# Patient Record
Sex: Male | Born: 2000
Health system: Southern US, Community
[De-identification: ages and names within clinical notes are randomized; demographics above are authoritative.]

## PROBLEM LIST (undated history)

## (undated) DIAGNOSIS — U071 COVID-19: Secondary | ICD-10-CM

---
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
## 2020-01-21 ENCOUNTER — Other Ambulatory Visit: Payer: Self-pay

## 2020-01-21 ENCOUNTER — Ambulatory Visit (INDEPENDENT_AMBULATORY_CARE_PROVIDER_SITE_OTHER): Payer: Medicaid Other | Admitting: Pediatrics

## 2020-01-21 VITALS — Wt 158.9 lb

## 2020-01-21 DIAGNOSIS — R05 Cough: Secondary | ICD-10-CM | POA: Diagnosis not present

## 2020-01-21 DIAGNOSIS — U071 COVID-19: Secondary | ICD-10-CM | POA: Diagnosis not present

## 2020-01-21 DIAGNOSIS — R059 Cough, unspecified: Secondary | ICD-10-CM

## 2020-01-21 LAB — POC SOFIA SARS ANTIGEN FIA: SARS:: POSITIVE — AB

## 2020-01-21 NOTE — Progress Notes (Signed)
  Subjective:    Jesus Holmes is a 19 y.o. old male here with his self for Headache and Cough   HPI: Jesus Holmes presents with history of 4-5 days of HA, runny nose, sore throat and poor sleep.  He continued to have sore throat, and then started with loss taste and smell 3 days ago.  Having some fatigue and body aches.  Works at [employer] ex and reports wearing masks, no known sick contacts at work or at home.  Denies any diff breathing, fevers, v/d.   The following portions of the patient's history were reviewed and updated as appropriate: allergies, current medications, past family history, past medical history, past social history, past surgical history and problem list.  Review of Systems Pertinent items are noted in HPI.   Allergies: No Known Allergies   Current Outpatient Medications on File Prior to Visit  Medication Sig Dispense Refill  . cetirizine (ZYRTEC) 1 MG/ML syrup Take 10 mLs (10 mg total) by mouth daily. 120 mL 5   No current facility-administered medications on file prior to visit.    History and Problem List: No past medical history on file.      Objective:    Wt 158 lb 14.4 oz (72.1 kg)   BMI 23.64 kg/m   General: alert, active, cooperative, non toxic ENT: oropharynx moist, OP mild erythema, no lesions, nares erythematous  Eye:  PERRL, EOMI, conjunctivae clear, no discharge Ears: TM clear/intact bilateral, no discharge Neck: supple, no sig LAD Lungs: clear to auscultation, no wheeze, crackles or retractions Heart: RRR, Nl S1, S2, no murmurs Abd: soft, non tender, non distended, normal BS, no organomegaly, no masses appreciated Skin: no rashes Neuro: normal mental status, No focal deficits  Results for orders placed or performed in visit on 01/21/20 (from the past 72 hour(s))  POC SOFIA Antigen FIA     Status: Abnormal   Collection Time: 01/21/20  4:19 PM  Result Value Ref Range   SARS: Positive (A) Negative       Assessment:   Jesus Holmes is a 19 y.o. old male  with  1. COVID-19 virus infection   2. Cough     Plan:   1.  Rapid covid positive.  Supportive care discussed for symptoms and what signs to monitor for that would need immediate evaluation at ER.    --Recommendations are to isolate for 10 days after symptom onset or positive test and at least 24hrs without fever.  Isolate in home away from family members and follow current guidelines when unable to distance.  Should have family members tested if onset of symptoms or in 5-8 days asymptomatic.  Discussed concerning symptoms that would need immediate evaluation like shortness of breath, chest pain or persistent symptoms.       No orders of the defined types were placed in this encounter.    Return if symptoms worsen or fail to improve. in 2-3 days or prior for concerns  Myles Gip, DO

## 2020-01-24 ENCOUNTER — Encounter: Payer: Self-pay | Admitting: Pediatrics

## 2020-01-24 NOTE — Patient Instructions (Signed)
COVID-19: Quarantine vs. Isolation QUARANTINE keeps someone who was in close contact with someone who has COVID-19 away from others. If you had close contact with a person who has COVID-19  Stay home until 14 days after your last contact.  Check your temperature twice a day and watch for symptoms of COVID-19.  If possible, stay away from people who are at higher-risk for getting very sick from COVID-19. ISOLATION keeps someone who is sick or tested positive for COVID-19 without symptoms away from others, even in their own home. If you are sick and think or know you have COVID-19  Stay home until after ? At least 10 days since symptoms first appeared and ? At least 24 hours with no fever without fever-reducing medication and ? Symptoms have improved If you tested positive for COVID-19 but do not have symptoms  Stay home until after ? 10 days have passed since your positive test If you live with others, stay in a specific "sick room" or area and away from other people or animals, including pets. Use a separate bathroom, if available. cdc.gov/coronavirus 04/23/2019 This information is not intended to replace advice given to you by your health care provider. Make sure you discuss any questions you have with your health care provider. Document Revised: 09/06/2019 Document Reviewed: 09/06/2019 Elsevier Patient Education  2020 Elsevier Inc.  

## 2020-03-06 ENCOUNTER — Encounter (HOSPITAL_COMMUNITY): Payer: Self-pay

## 2020-03-06 ENCOUNTER — Emergency Department (HOSPITAL_COMMUNITY)
Admission: EM | Admit: 2020-03-06 | Discharge: 2020-03-07 | Discharge status: Against Medical Advice | Payer: Medicaid Other | Attending: Emergency Medicine | Admitting: Emergency Medicine

## 2020-03-06 DIAGNOSIS — Z5321 Procedure and treatment not carried out due to patient leaving prior to being seen by health care provider: Secondary | ICD-10-CM | POA: Insufficient documentation

## 2020-03-06 DIAGNOSIS — J029 Acute pharyngitis, unspecified: Secondary | ICD-10-CM | POA: Diagnosis not present

## 2020-03-06 DIAGNOSIS — Z20822 Contact with and (suspected) exposure to covid-19: Secondary | ICD-10-CM | POA: Insufficient documentation

## 2020-03-06 LAB — CBC WITH DIFFERENTIAL/PLATELET
Abs Immature Granulocytes: 0.02 10*3/uL (ref 0.00–0.07)
Basophils Absolute: 0 10*3/uL (ref 0.0–0.1)
Basophils Relative: 1 %
Eosinophils Absolute: 0 10*3/uL (ref 0.0–0.5)
Eosinophils Relative: 0 %
HCT: 49 % (ref 39.0–52.0)
Hemoglobin: 16.4 g/dL (ref 13.0–17.0)
Immature Granulocytes: 0 %
Lymphocytes Relative: 11 %
Lymphs Abs: 0.7 10*3/uL (ref 0.7–4.0)
MCH: 29.9 pg (ref 26.0–34.0)
MCHC: 33.5 g/dL (ref 30.0–36.0)
MCV: 89.3 fL (ref 80.0–100.0)
Monocytes Absolute: 0.7 10*3/uL (ref 0.1–1.0)
Monocytes Relative: 11 %
Neutro Abs: 4.8 10*3/uL (ref 1.7–7.7)
Neutrophils Relative %: 77 %
Platelets: 140 10*3/uL — ABNORMAL LOW (ref 150–400)
RBC: 5.49 MIL/uL (ref 4.22–5.81)
RDW: 11.9 % (ref 11.5–15.5)
WBC: 6.3 10*3/uL (ref 4.0–10.5)
nRBC: 0 % (ref 0.0–0.2)

## 2020-03-06 LAB — POC SARS CORONAVIRUS 2 AG -  ED: SARS Coronavirus 2 Ag: NEGATIVE

## 2020-03-06 LAB — BASIC METABOLIC PANEL
Anion gap: 13 (ref 5–15)
BUN: 6 mg/dL (ref 6–20)
CO2: 23 mmol/L (ref 22–32)
Calcium: 9.6 mg/dL (ref 8.9–10.3)
Chloride: 100 mmol/L (ref 98–111)
Creatinine, Ser: 1.18 mg/dL (ref 0.61–1.24)
GFR calc Af Amer: >60 mL/min (ref >60)
GFR calc non Af Amer: >60 mL/min (ref >60)
Glucose, Bld: 95 mg/dL (ref 70–99)
Potassium: 3.7 mmol/L (ref 3.5–5.1)
Sodium: 136 mmol/L (ref 135–145)

## 2020-03-06 LAB — GROUP A STREP BY PCR: Group A Strep by PCR: NOT DETECTED

## 2020-03-06 MED ORDER — ACETAMINOPHEN 325 MG PO TABS
650.0000 mg | ORAL_TABLET | Freq: Once | ORAL | Status: AC | PRN
  Administered 2020-03-06: 650 mg via ORAL
  Filled 2020-03-06: qty 2

## 2020-03-06 NOTE — ED Triage Notes (Signed)
Pt arrives to ED w/ c/o sore throat, fever, body aches, and neck stiffness.

## 2020-03-06 NOTE — ED Notes (Signed)
Pt's Mom politely stated that they needed to go home.  Temp taken and is 100.3.  Advised that he had tylenol at roughly 1730.  She stated she would give more at home.

## 2020-03-09 ENCOUNTER — Inpatient Hospital Stay (HOSPITAL_COMMUNITY)
Admission: EM | Admit: 2020-03-09 | Discharge: 2020-03-11 | DRG: 287 | Disposition: A | Discharge status: Home / Self Care | Payer: Medicaid Other | Attending: Cardiology | Admitting: Cardiology

## 2020-03-09 ENCOUNTER — Encounter (HOSPITAL_COMMUNITY): Payer: Self-pay

## 2020-03-09 ENCOUNTER — Encounter (HOSPITAL_COMMUNITY)
Admission: EM | Disposition: A | Discharge status: Home / Self Care | Payer: Self-pay | Source: Home / Self Care | Attending: Cardiology

## 2020-03-09 ENCOUNTER — Other Ambulatory Visit: Payer: Self-pay

## 2020-03-09 ENCOUNTER — Emergency Department (HOSPITAL_COMMUNITY): Payer: Medicaid Other

## 2020-03-09 DIAGNOSIS — B948 Sequelae of other specified infectious and parasitic diseases: Secondary | ICD-10-CM

## 2020-03-09 DIAGNOSIS — I5082 Biventricular heart failure: Secondary | ICD-10-CM | POA: Diagnosis present

## 2020-03-09 DIAGNOSIS — I309 Acute pericarditis, unspecified: Secondary | ICD-10-CM | POA: Diagnosis not present

## 2020-03-09 DIAGNOSIS — I5021 Acute systolic (congestive) heart failure: Secondary | ICD-10-CM | POA: Diagnosis not present

## 2020-03-09 DIAGNOSIS — I400 Infective myocarditis: Secondary | ICD-10-CM

## 2020-03-09 DIAGNOSIS — I514 Myocarditis, unspecified: Secondary | ICD-10-CM | POA: Diagnosis not present

## 2020-03-09 DIAGNOSIS — R9431 Abnormal electrocardiogram [ECG] [EKG]: Secondary | ICD-10-CM

## 2020-03-09 DIAGNOSIS — Z8249 Family history of ischemic heart disease and other diseases of the circulatory system: Secondary | ICD-10-CM

## 2020-03-09 DIAGNOSIS — Z20822 Contact with and (suspected) exposure to covid-19: Secondary | ICD-10-CM | POA: Diagnosis present

## 2020-03-09 DIAGNOSIS — R072 Precordial pain: Secondary | ICD-10-CM | POA: Diagnosis not present

## 2020-03-09 DIAGNOSIS — R079 Chest pain, unspecified: Secondary | ICD-10-CM | POA: Diagnosis not present

## 2020-03-09 DIAGNOSIS — I10 Essential (primary) hypertension: Secondary | ICD-10-CM | POA: Diagnosis not present

## 2020-03-09 DIAGNOSIS — R509 Fever, unspecified: Secondary | ICD-10-CM | POA: Diagnosis not present

## 2020-03-09 DIAGNOSIS — B3322 Viral myocarditis: Secondary | ICD-10-CM | POA: Diagnosis not present

## 2020-03-09 HISTORY — PX: RIGHT/LEFT HEART CATH AND CORONARY ANGIOGRAPHY: CATH118266

## 2020-03-09 HISTORY — DX: COVID-19: U07.1

## 2020-03-09 LAB — TROPONIN I (HIGH SENSITIVITY)
Troponin I (High Sensitivity): 9910 ng/L (ref <18)
Troponin I (High Sensitivity): 24304 ng/L (ref <18)

## 2020-03-09 LAB — COMPREHENSIVE METABOLIC PANEL
ALT: 69 U/L — ABNORMAL HIGH (ref 0–44)
AST: 100 U/L — ABNORMAL HIGH (ref 15–41)
Albumin: 3.7 g/dL (ref 3.5–5.0)
Alkaline Phosphatase: 94 U/L (ref 38–126)
Anion gap: 13 (ref 5–15)
BUN: 8 mg/dL (ref 6–20)
CO2: 22 mmol/L (ref 22–32)
Calcium: 9.3 mg/dL (ref 8.9–10.3)
Chloride: 103 mmol/L (ref 98–111)
Creatinine, Ser: 1.01 mg/dL (ref 0.61–1.24)
GFR calc Af Amer: >60 mL/min (ref >60)
GFR calc non Af Amer: >60 mL/min (ref >60)
Glucose, Bld: 123 mg/dL — ABNORMAL HIGH (ref 70–99)
Potassium: 3.6 mmol/L (ref 3.5–5.1)
Sodium: 138 mmol/L (ref 135–145)
Total Bilirubin: 1.1 mg/dL (ref 0.3–1.2)
Total Protein: 7 g/dL (ref 6.5–8.1)

## 2020-03-09 LAB — CBC
HCT: 46.3 % (ref 39.0–52.0)
Hemoglobin: 15.8 g/dL (ref 13.0–17.0)
MCH: 29.5 pg (ref 26.0–34.0)
MCHC: 34.1 g/dL (ref 30.0–36.0)
MCV: 86.5 fL (ref 80.0–100.0)
Platelets: 152 10*3/uL (ref 150–400)
RBC: 5.35 MIL/uL (ref 4.22–5.81)
RDW: 12.1 % (ref 11.5–15.5)
WBC: 6.4 10*3/uL (ref 4.0–10.5)
nRBC: 0 % (ref 0.0–0.2)

## 2020-03-09 LAB — APTT: aPTT: 110 seconds — ABNORMAL HIGH (ref 24–36)

## 2020-03-09 LAB — C-REACTIVE PROTEIN: CRP: 14.2 mg/dL — ABNORMAL HIGH (ref <1.0)

## 2020-03-09 LAB — SARS CORONAVIRUS 2 BY RT PCR (HOSPITAL ORDER, PERFORMED IN ~~LOC~~ HOSPITAL LAB): SARS Coronavirus 2: NEGATIVE

## 2020-03-09 LAB — PROTIME-INR
INR: 1.2 (ref 0.8–1.2)
Prothrombin Time: 14.2 seconds (ref 11.4–15.2)

## 2020-03-09 LAB — LIPID PANEL
Cholesterol: 136 mg/dL (ref 0–200)
HDL: 39 mg/dL — ABNORMAL LOW (ref >40)
LDL Cholesterol: 84 mg/dL (ref 0–99)
Total CHOL/HDL Ratio: 3.5 RATIO
Triglycerides: 63 mg/dL (ref <150)
VLDL: 13 mg/dL (ref 0–40)

## 2020-03-09 LAB — TSH: TSH: 1.75 u[IU]/mL (ref 0.350–4.500)

## 2020-03-09 LAB — HEMOGLOBIN A1C
Hgb A1c MFr Bld: 5.4 % (ref 4.8–5.6)
Mean Plasma Glucose: 108.28 mg/dL

## 2020-03-09 LAB — HIV ANTIBODY (ROUTINE TESTING W REFLEX): HIV Screen 4th Generation wRfx: NONREACTIVE

## 2020-03-09 LAB — SEDIMENTATION RATE: Sed Rate: 12 mm/hr (ref 0–16)

## 2020-03-09 LAB — MRSA PCR SCREENING: MRSA by PCR: NEGATIVE

## 2020-03-09 SURGERY — RIGHT/LEFT HEART CATH AND CORONARY ANGIOGRAPHY
Anesthesia: LOCAL

## 2020-03-09 MED ORDER — VERAPAMIL HCL 2.5 MG/ML IV SOLN
INTRAVENOUS | Status: DC | PRN
  Administered 2020-03-09: 10 mL via INTRA_ARTERIAL

## 2020-03-09 MED ORDER — SODIUM CHLORIDE 0.9% FLUSH
3.0000 mL | Freq: Once | INTRAVENOUS | Status: AC
  Administered 2020-03-09: 3 mL via INTRAVENOUS

## 2020-03-09 MED ORDER — FENTANYL CITRATE (PF) 100 MCG/2ML IJ SOLN
INTRAMUSCULAR | Status: AC
  Filled 2020-03-09: qty 2

## 2020-03-09 MED ORDER — HEPARIN SODIUM (PORCINE) 1000 UNIT/ML IJ SOLN
INTRAMUSCULAR | Status: DC | PRN
  Administered 2020-03-09: 4000 [IU] via INTRAVENOUS

## 2020-03-09 MED ORDER — ASPIRIN 81 MG PO CHEW
324.0000 mg | CHEWABLE_TABLET | Freq: Once | ORAL | Status: AC
  Administered 2020-03-09: 324 mg via ORAL
  Filled 2020-03-09: qty 4

## 2020-03-09 MED ORDER — COLCHICINE 0.6 MG PO TABS
0.6000 mg | ORAL_TABLET | Freq: Two times a day (BID) | ORAL | Status: DC
  Administered 2020-03-09 – 2020-03-11 (×5): 0.6 mg via ORAL
  Filled 2020-03-09 (×5): qty 1

## 2020-03-09 MED ORDER — VERAPAMIL HCL 2.5 MG/ML IV SOLN
INTRAVENOUS | Status: AC
  Filled 2020-03-09: qty 2

## 2020-03-09 MED ORDER — NITROGLYCERIN 0.4 MG SL SUBL: 0.4000 mg | SUBLINGUAL_TABLET | SUBLINGUAL | Status: DC | PRN

## 2020-03-09 MED ORDER — LIDOCAINE HCL (PF) 1 % IJ SOLN
INTRAMUSCULAR | Status: DC | PRN
  Administered 2020-03-09: 20 mL
  Administered 2020-03-09: 3 mL

## 2020-03-09 MED ORDER — MIDAZOLAM HCL 2 MG/2ML IJ SOLN
INTRAMUSCULAR | Status: AC
  Filled 2020-03-09: qty 2

## 2020-03-09 MED ORDER — ENOXAPARIN SODIUM 40 MG/0.4ML ~~LOC~~ SOLN
40.0000 mg | SUBCUTANEOUS | Status: DC
  Administered 2020-03-10 – 2020-03-11 (×2): 40 mg via SUBCUTANEOUS
  Filled 2020-03-09 (×2): qty 0.4

## 2020-03-09 MED ORDER — IBUPROFEN 600 MG PO TABS
800.0000 mg | ORAL_TABLET | Freq: Three times a day (TID) | ORAL | Status: DC
  Administered 2020-03-09 – 2020-03-11 (×6): 800 mg via ORAL
  Filled 2020-03-09 (×4): qty 4
  Filled 2020-03-09 (×2): qty 1

## 2020-03-09 MED ORDER — FENTANYL CITRATE (PF) 100 MCG/2ML IJ SOLN
INTRAMUSCULAR | Status: DC | PRN
  Administered 2020-03-09 (×2): 25 ug via INTRAVENOUS

## 2020-03-09 MED ORDER — HEPARIN SODIUM (PORCINE) 1000 UNIT/ML IJ SOLN
INTRAMUSCULAR | Status: AC
  Filled 2020-03-09: qty 1

## 2020-03-09 MED ORDER — ACETAMINOPHEN 325 MG PO TABS: 650.0000 mg | ORAL_TABLET | ORAL | Status: DC | PRN

## 2020-03-09 MED ORDER — ONDANSETRON HCL 4 MG/2ML IJ SOLN: 4.0000 mg | Freq: Four times a day (QID) | INTRAMUSCULAR | Status: DC | PRN

## 2020-03-09 MED ORDER — LIDOCAINE HCL (PF) 1 % IJ SOLN
INTRAMUSCULAR | Status: AC
  Filled 2020-03-09: qty 30

## 2020-03-09 MED ORDER — IOHEXOL 350 MG/ML SOLN
INTRAVENOUS | Status: DC | PRN
  Administered 2020-03-09: 50 mL via INTRA_ARTERIAL

## 2020-03-09 MED ORDER — HEPARIN (PORCINE) IN NACL 1000-0.9 UT/500ML-% IV SOLN
INTRAVENOUS | Status: DC | PRN
  Administered 2020-03-09 (×2): 500 mL

## 2020-03-09 MED ORDER — SODIUM CHLORIDE 0.9 % IV SOLN
INTRAVENOUS | Status: AC | PRN
  Administered 2020-03-09: 10 mL/h via INTRAVENOUS

## 2020-03-09 MED ORDER — MIDAZOLAM HCL 2 MG/2ML IJ SOLN
INTRAMUSCULAR | Status: DC | PRN
  Administered 2020-03-09 (×2): 1 mg via INTRAVENOUS

## 2020-03-09 MED ORDER — KETOROLAC TROMETHAMINE 30 MG/ML IJ SOLN
30.0000 mg | Freq: Once | INTRAMUSCULAR | Status: AC
  Administered 2020-03-09: 30 mg via INTRAVENOUS
  Filled 2020-03-09: qty 1

## 2020-03-09 MED ORDER — HEPARIN (PORCINE) IN NACL 1000-0.9 UT/500ML-% IV SOLN
INTRAVENOUS | Status: AC
  Filled 2020-03-09: qty 1000

## 2020-03-09 MED ORDER — PANTOPRAZOLE SODIUM 40 MG PO TBEC
40.0000 mg | DELAYED_RELEASE_TABLET | Freq: Every day | ORAL | Status: DC
  Administered 2020-03-09 – 2020-03-11 (×3): 40 mg via ORAL
  Filled 2020-03-09 (×4): qty 1

## 2020-03-09 SURGICAL SUPPLY — 13 items
CATH INFINITI JR4 5F (CATHETERS) ×2 IMPLANT
CATH OPTITORQUE TIG 4.0 6F (CATHETERS) ×2 IMPLANT
CATH SWAN GANZ 7F STRAIGHT (CATHETERS) ×2 IMPLANT
DEVICE RAD COMP TR BAND LRG (VASCULAR PRODUCTS) ×2 IMPLANT
GLIDESHEATH SLEND SS 6F .021 (SHEATH) ×2 IMPLANT
GUIDEWIRE INQWIRE 1.5J.035X260 (WIRE) ×1 IMPLANT
INQWIRE 1.5J .035X260CM (WIRE) ×2
KIT HEART LEFT (KITS) ×2 IMPLANT
PACK CARDIAC CATHETERIZATION (CUSTOM PROCEDURE TRAY) ×2 IMPLANT
SHEATH PINNACLE 7F 10CM (SHEATH) ×2 IMPLANT
SHEATH PROBE COVER 6X72 (BAG) ×2 IMPLANT
TRANSDUCER W/STOPCOCK (MISCELLANEOUS) ×2 IMPLANT
TUBING CIL FLEX 10 FLL-RA (TUBING) ×2 IMPLANT

## 2020-03-09 NOTE — Progress Notes (Signed)
° °  See H and P  CRITICAL CARE Performed by: Donato Schultz   Total critical care time: 60 minutes  Critical care time was exclusive of separately billable procedures and treating other patients.  Critical care was necessary to treat or prevent imminent or life-threatening deterioration.  Critical care was time spent personally by me on the following activities: development of treatment plan with patient and/or surrogate as well as nursing, discussions with consultants, evaluation of patient's response to treatment, examination of patient, obtaining history from patient or surrogate, ordering and performing treatments and interventions, ordering and review of laboratory studies, ordering and review of radiographic studies, pulse oximetry and re-evaluation of patient's condition.  Donato Schultz, MD

## 2020-03-09 NOTE — H&P (Addendum)
History & Physical    Patient ID: Jesus Holmes MRN: [id number], DOB/AGE: May 24, 2001   Admit date: 03/09/2020  Primary Care Provider: Pediatrics, [facility] Cardiologist: New to [facility] - Dr. Anne Fu  Chief Complaint: Chest Pain  Patient Profile    Jesus Holmes is a 19 y.o. male with prior history of COVID-19 virus (positive in 01/2020) and no prior cardiac history who presented to [facility] on 03/09/2020 for evaluation of chest pain. Initial EKG showed diffuse ST elevation along the inferior and anterolateral leads with CODE STEMI being activated.   History of Present Illness    Jesus Holmes reports being in his usual state of health until this past Tuesday when he developed subjective fever and chills. He reports a mild discomfort along his precordial region at that time but symptoms continued to intensify. His episodes of chest discomfort were episodic yesterday but progressed throughout the day. Symptoms were exacerbated with lying down and he slept on his stomach last night for relief. This morning, his chest discomfort persisted and he reports associated radiating pain into the middle of his back along with his left arm. He informed his father who recommended ED evaluation.  The patient is unaware of any personal history of cardiac issues. He was diagnosed with COVID-19 in 01/2020 but denies any persistent dyspnea or symptoms since his diagnosis. He does have a paternal uncle who was diagnosed with CAD at age 21. His paternal grandfather is followed by cardiology for a murmur per their report. The patient denies any history of tobacco use or recreational drug use.  A majority of his labs are still pending at this time but WBC is normal at 6.4. Hemoglobin at 15.8 with platelets of 152. Hgb A1c at 5.4. HS Troponin, CRP and Sed rate are pending. CXR pending. EKG shows NSR, HR 67 with ST elevation along the inferior and anterolateral leads with reciprocal changes present.     He still reports significant pain at this time. Scheduled to receive ASA 324mg  and IV Toradol 30mg .    Past Medical History:  Diagnosis Date  . COVID-19 virus detected    a. positive for COVID -19 in 01/2020.    History reviewed. No pertinent surgical history.   Medications Prior to Admission: Prior to Admission medications   Medication Sig Start Date End Date Taking? Authorizing Provider  cetirizine (ZYRTEC) 1 MG/ML syrup Take 10 mLs (10 mg total) by mouth daily. 07/22/15 08/22/15  07/24/15, MD     Allergies:   No Known Allergies  Social History:   Social History   Socioeconomic History  . Marital status: Single    Spouse name: Not on file  . Number of children: Not on file  . Years of education: Not on file  . Highest education level: Not on file  Occupational History  . Not on file  Tobacco Use  . Smoking status: Never Smoker  . Smokeless tobacco: Never Used  Substance and Sexual Activity  . Alcohol use: No  . Drug use: No  . Sexual activity: Never  Other Topics Concern  . Not on file  Social History Narrative   08/24/15 in [place]   freshman   Social Determinants of Health   Financial Resource Strain:   . Difficulty of Paying Living Expenses:   Food Insecurity:   . Worried About Rennis Golden in the Last Year:   . [employer] of Food in the Last Year:  Transportation Needs:   . Freight forwarder (Medical):   Marland Kitchen Lack of Transportation (Non-Medical):   Physical Activity:   . Days of Exercise per Week:   . Minutes of Exercise per Session:   Stress:   . Feeling of Stress :   Social Connections:   . Frequency of Communication with Friends and Family:   . Frequency of Social Gatherings with Friends and Family:   . Attends Religious Services:   . Active Member of Clubs or Organizations:   . Attends Banker Meetings:   Marland Kitchen Marital Status:   Intimate Partner Violence:   . Fear of Current or Ex-Partner:   .  Emotionally Abused:   Marland Kitchen Physically Abused:   . Sexually Abused:      Family History:  The patient's family history includes Cancer in his maternal grandfather and maternal grandmother; Diabetes in his maternal grandfather, maternal grandmother, and paternal grandmother; Heart disease in his paternal grandfather; Hyperlipidemia in his paternal grandfather and paternal grandmother; Hypertension in his paternal grandfather and paternal grandmother; Sleep apnea in his father. There is no history of Hearing loss, Early death, Drug abuse, Depression, COPD, Birth defects, Asthma, Arthritis, Alcohol abuse, Kidney disease, Learning disabilities, Mental retardation, Miscarriages / Stillbirths, Stroke, Vision loss, or Varicose Veins.     Review of Systems    General:  No chills, fever, night sweats or weight changes.  Cardiovascular:  No  dyspnea on exertion, edema, orthopnea, palpitations, paroxysmal nocturnal dyspnea. Positive for chest pain.  Dermatological: No rash, lesions/masses Respiratory: No cough, dyspnea Urologic: No hematuria, dysuria Abdominal:   No nausea, vomiting, diarrhea, bright red blood per rectum, melena, or hematemesis Neurologic:  No visual changes, wkns, changes in mental status. All other systems reviewed and are otherwise negative except as noted above.  Physical Exam    Vitals:   03/09/20 1233 03/09/20 1254  BP: 108/90   Pulse: 68   Resp: 17   Temp: 98.4 F (36.9 C)   SpO2: 99%   Weight: 81.6 kg 81.6 kg  Height: 5\' 11"  (1.803 m) 5\' 11"  (1.803 m)   No intake or output data in the 24 hours ending 03/09/20 1332 Filed Weights   03/09/20 1233 03/09/20 1254  Weight: 81.6 kg 81.6 kg   Body mass index is 25.1 kg/m.   General: Well developed, well nourished,male in no acute distress. Head: Normocephalic, atraumatic, sclera non-icteric, no xanthomas, nares are without discharge. Neck: No carotid bruits. JVD not elevated.  Lungs: Respirations regular and unlabored,  without wheezes or rales.  Heart: Regular rate and rhythm. No S3 or S4.  No murmur, no rubs, or gallops appreciated. Abdomen: Soft, non-tender, non-distended with normoactive bowel sounds. No hepatomegaly. No rebound/guarding. No obvious abdominal masses. Msk:  Strength and tone appear normal for age. No joint deformities or effusions. Extremities: No clubbing or cyanosis. No edema.  Distal pedal pulses are 2+ bilaterally. Neuro: Alert and oriented X 3. Moves all extremities spontaneously. No focal deficits noted. Psych:  Responds to questions appropriately with a normal affect. Skin: No rashes or lesions noted  Labs and Radiology Studies    EKG:  The ECG that was done was personally reviewed and demonstrates NSR, HR 67 with ST elevation along the inferior and anterolateral leads with reciprocal changes present.    Relevant CV Studies:  Echocardiogram: Pending  Laboratory Data:  Chemistry Recent Labs  Lab 03/06/20 2011  NA 136  K 3.7  CL 100  CO2 23  GLUCOSE 95  BUN 6  CREATININE 1.18  CALCIUM 9.6  GFRNONAA >60  GFRAA >60  ANIONGAP 13    No results for input(s): PROT, ALBUMIN, AST, ALT, ALKPHOS, BILITOT in the last 168 hours. Hematology Recent Labs  Lab 03/06/20 2011 03/09/20 1246  WBC 6.3 6.4  RBC 5.49 5.35  HGB 16.4 15.8  HCT 49.0 46.3  MCV 89.3 86.5  MCH 29.9 29.5  MCHC 33.5 34.1  RDW 11.9 12.1  PLT 140* 152   Cardiac EnzymesNo results for input(s): TROPONINI in the last 168 hours. No results for input(s): TROPIPOC in the last 168 hours.  BNPNo results for input(s): BNP, PROBNP in the last 168 hours.  DDimer No results for input(s): DDIMER in the last 168 hours.  Radiology/Studies:  DG Chest Port 1 View  Result Date: 03/09/2020 CLINICAL DATA:  Chest pain and tightness since Tuesday, fever at home, code STEMI EXAM: PORTABLE CHEST 1 VIEW COMPARISON:  Portable exam 1313 hours without priors for comparison FINDINGS: Normal heart size, mediastinal contours, and  pulmonary vascularity. Lungs clear. No pleural effusion or pneumothorax. Bones unremarkable. IMPRESSION: No acute abnormalities. Electronically Signed   By: Lavonia Dana M.D.   On: 03/09/2020 13:29    Assessment and Plan:   1. Chest Pain concerning for Myocarditis - reports episodic fever and chills which started 5 days prior to his presentation but he has experienced chest discomfort for the past several days. Symptoms progressed in severity over the past 24 hours and pain has been exacerbated with reclining on his back and improved with resting on his stomach.  -  HS Troponin, CRP and Sed rate are pending. EKG shows NSR, HR 67 with ST elevation along the inferior and anterolateral leads with reciprocal changes present.   - Will plan to obtain an official echocardiogram. Reviewed with Dr. Marlou Porch and will order Ibuprofen 800mg  TID and Colchicine 0.6mg  BID. No Heparin for now.   ADDENDUM: Dr. Marlou Porch performed a bedside US which showed biventricular dysfunction. Discussed further with Dr. Aundra Dubin and Dr. Claiborne Billings and the decision was made to proceed with a cardiac catheterization. Further recommendations pending the procedure.   Severity of Illness: The appropriate patient status for this patient is INPATIENT. Inpatient status is judged to be reasonable and necessary in order to provide the required intensity of service to ensure the patient's safety. The patient's presenting symptoms, physical exam findings, and initial radiographic and laboratory data in the context of their chronic comorbidities is felt to place them at high risk for further clinical deterioration. Furthermore, it is not anticipated that the patient will be medically stable for discharge from the hospital within 2 midnights of admission. The following factors support the patient status of inpatient.   " The patient's presenting symptoms include chest pain. " The initial radiographic and laboratory data are worrisome because of abnormal  EKG and Elevated HS Troponin. " The chronic co-morbidities include none.   * I certify that at the point of admission it is my clinical judgment that the patient will require inpatient hospital care spanning beyond 2 midnights from the point of admission due to high intensity of service, high risk for further deterioration and high frequency of surveillance required.*    For questions or updates, please contact [place] Please consult [email or web address] for contact info under Cardiology/STEMI.   Signed, Erma Heritage, PA-C 03/09/2020, 1:32 PM Pager: [phone]  Personally seen and examined. Agree with above.   19 year old male with Covid infection approximately 1 month ago with loss  of taste, no shortness of breath, works at [employer], quite active, occasional smoker with no prior cardiac history no diabetes no hypertension who came in today after 4 days of worsening chest discomfort, last night hard to lay flat and sleep, chest and back discomfort occasional left arm discomfort.  He also had fevers he states as well.  Here in the ER, we were able to do a bedside echocardiogram which demonstrated biventricular failure with RV dilatation, decreased contraction as well as what appears to be global left ventricular dysfunction EF approximately 30%.  No significant valvular abnormalities noted.  EKG was concerning for marked inferior lateral ST elevation, highest point in V5 approximately 7 mm.  GEN: Well nourished, well developed, in no acute distress, appears uncomfortable in the bed when laying down.  Trying to sit up.  Able to answer questions appropriately. HEENT: normal  Neck: no JVD, carotid bruits, or masses Cardiac: RRR; no murmurs, rubs, or gallops,no edema  Respiratory:  clear to auscultation bilaterally, normal work of breathing GI: soft, nontender, nondistended, + BS MS: no deformity or atrophy  Skin: warm and dry, no rash Neuro:  Alert and Oriented x 3, Strength and  sensation are intact Psych: euthymic mood, full affect  Father originally at bedside, mother came in as well as we were discussing plans for heart catheterization.  Troponin came back at 9900 White count 6.4 LDL 84 creatinine 1.01 AST 100 ALT 69  Assessment and plan:  Acute myopericarditis -EKG demonstrates fairly significant ST elevations in the inferior as well as the lateral precordial leads.  These are quite concerning.  Do not follow the typical J-point elevation type pattern as we normally see and pericarditis however given his history of 4 days of worsening discomfort as well as fever and recent Covid infection, this is likely to represent myopericarditis.  After further discussion with multiple team members including Dr. Tresa Endo of interventional cardiology and Dr. Shirlee Latch of advanced heart failure, we have decided to proceed with right and left heart catheterization not only to exclude the possible thrombotic lesion but also to check his right and left sided heart pressures and to measure cardiac outputs.  Once again, he does appear to have biventricular failure on bedside echocardiogram.  We will obtain a formal echocardiogram as well. -We have given him Toradol 30 mg IV in the emergency room.  We will likely proceed with ibuprofen and colchicine as well.  Checking C-reactive protein and sedimentation rate as well as TSH. -Tomorrow likely to obtain cardiac MRI to look for other signs of inflammation. -Risks and benefits have been explained for stroke heart attack death renal impairment bleeding. Willing to proceed. Mother and father aware, discussed.   Donato Schultz, MD

## 2020-03-09 NOTE — ED Notes (Signed)
Cardiology at bedside with ultrasound

## 2020-03-09 NOTE — ED Provider Notes (Signed)
Medical screening examination/treatment/procedure(s) were conducted as a shared visit with non-physician practitioner(s) and myself.  I personally evaluated the patient during the encounter. Briefly, the patient is a 19 y.o. male no significant medical history presents the ED with chest pain.  EKG upon arrival concerning for STEMI given ST elevations inferiorly and laterally with ST depressions anteriorly.  Code STEMI activated.  Dr. Anne Fu from cardiology came down to the ED to evaluate the patient.  Patient did have Covid several months ago.  Has had fever and viral type symptoms over the last several days.  Bedside ultrasound performed by myself showed no large pericardial effusion.  Did show decreased EF.  Troponin came back at 9900.  Otherwise lab work overall unremarkable.  Patient to be discussed with interventional cardiologist by Dr. Anne Fu and after discussion they will take the patient to the Cath Lab to rule out any obstruction/ACS however likely a myocarditis.  Chest x-ray overall showed no pleural effusion or pulmonary edema.  Patient overall appears comfortable on exam.  Patient went to the Cath Lab and will be admitted to cardiac ICU for further care.  This chart was dictated using voice recognition software.  Despite best efforts to proofread,  errors can occur which can change the documentation meaning.     EKG Interpretation  Date/Time:  Sunday March 09 2020 12:47:23 EDT Ventricular Rate:  66 PR Interval:    QRS Duration: 87 QT Interval:  380 QTC Calculation: 399 R Axis:   92 Text Interpretation: Sinus or ectopic atrial rhythm Borderline right axis deviation LVH by voltage  ST elevation, inferior leads.lateral leads as well Confirmed by Virgina Norfolk [phone]) on 03/09/2020 1:43:15 PM           Virgina Norfolk, DO 03/09/20 1346

## 2020-03-09 NOTE — Progress Notes (Signed)
Dr. Anne Fu discussed with Dr. Shirlee Latch and Dr. Shirlee Latch told him to put on CHF service. I have assigned to their rounds.

## 2020-03-09 NOTE — Interval H&P Note (Signed)
Cath Lab Visit (complete for each Cath Lab visit)  Clinical Evaluation Leading to the Procedure:   ACS: Yes.    Non-ACS:    Anginal Classification: CCS IV  Anti-ischemic medical therapy: No Therapy  Non-Invasive Test Results: No non-invasive testing performed  Prior CABG: No previous CABG      History and Physical Interval Note:  03/09/2020 2:16 PM  Jesus Holmes  has presented today for surgery, with the diagnosis of STEMI.  The various methods of treatment have been discussed with the patient and family. After consideration of risks, benefits and other options for treatment, the patient has consented to  Procedure(s): Coronary/Graft Acute MI Revascularization (N/A) LEFT HEART CATH AND CORONARY ANGIOGRAPHY (N/A) as a surgical intervention.  The patient's history has been reviewed, patient examined, no change in status, stable for surgery.  I have reviewed the patient's chart and labs.  Questions were answered to the patient's satisfaction.     [name]

## 2020-03-09 NOTE — ED Provider Notes (Addendum)
[facility] EMERGENCY DEPARTMENT Provider Note   CSN: [id number] Arrival date & time: 03/09/20  1229     History Chief Complaint  Patient presents with   Code STEMI   Chest Pain    Jesus Holmes is a 19 y.o. male presents to the ER for evaluation of chest pain.  Code STEMI called from triage by EDP Curatolo.  Patient reports chest pain that began 2 days ago.  It is described as tight, substernal with some radiation to the epigastrium, left arm.  Initially the chest pain was intermittent and worse when he tried to go to sleep, lay on his back and on his side and better when he laid on his stomach to sleep and leaned forward but today the pain has been constant without any alleviating factors.  Chest pain is worse with moving, standing, coughing, breathing. Reports 5 days ago he started feeling bad and had low-grade fever, sore throat, cough, body aches.  He came to the ER to be evaluated but left without being seen due to wait time.  He tested positive for Covid in April 2021 and has felt fine after he completed his quarantine period.  He denies any significant past medical history.  He denies any history of tobacco use.  No IV drug use.  States his grandparents had heart issues but unsure of age of onset.  No interventions.  No modifying factors.  Denies significant shortness of breath, nausea, vomiting. No syncope. No leg swelling or calf pain. No history of blood clots.   HPI     Past Medical History:  Diagnosis Date   COVID-19 virus detected    a. positive for COVID -19 in 01/2020.    Patient Active Problem List   Diagnosis Date Noted   Myocarditis ([place]) 03/09/2020   Encounter for PPD skin test reading 06/06/2019   BMI (body mass index), pediatric, 5% to less than 85% for age 77/19/2017   Encounter for PPD test 06/02/2013    History reviewed. No pertinent surgical history.     Family History  Problem Relation Age of Onset   Sleep apnea Father      Cancer Maternal Grandmother        ovarian   Diabetes Maternal Grandmother    Cancer Maternal Grandfather        prostate   Diabetes Maternal Grandfather    Diabetes Paternal Grandmother    Hyperlipidemia Paternal Grandmother    Hypertension Paternal Grandmother    Hypertension Paternal Grandfather    Hyperlipidemia Paternal Grandfather    Heart disease Paternal Grandfather    Hearing loss Neg Hx    Early death Neg Hx    Drug abuse Neg Hx    Depression Neg Hx    COPD Neg Hx    Birth defects Neg Hx    Asthma Neg Hx    Arthritis Neg Hx    Alcohol abuse Neg Hx    Kidney disease Neg Hx    Learning disabilities Neg Hx    Mental retardation Neg Hx    Miscarriages / Stillbirths Neg Hx    Stroke Neg Hx    Vision loss Neg Hx    Varicose Veins Neg Hx     Social History   Tobacco Use   Smoking status: Never Smoker   Smokeless tobacco: Never Used  Substance Use Topics   Alcohol use: No   Drug use: No    Home Medications Prior to Admission medications  Medication Sig Start Date End Date Taking? Authorizing Provider  cetirizine (ZYRTEC) 1 MG/ML syrup Take 10 mLs (10 mg total) by mouth daily. 07/22/15 08/22/15  Marcha Solders, MD    Allergies    Patient has no known allergies.  Review of Systems   Review of Systems  Constitutional: Positive for fever.  HENT: Positive for sore throat.   Respiratory: Positive for choking.   Cardiovascular: Positive for chest pain.  Musculoskeletal: Positive for myalgias (left arm).  All other systems reviewed and are negative.   Physical Exam Updated Vital Signs BP (!) 124/96    Pulse 69    Temp 98.4 F (36.9 C)    Resp 18    Ht '5\' 11"'  (1.803 m)    Wt 81.6 kg    SpO2 98%    BMI 25.10 kg/m   Physical Exam Constitutional:      Appearance: He is well-developed.     Comments: NAD. Non toxic.   HENT:     Head: Normocephalic and atraumatic.     Nose: Nose normal.  Eyes:     General: Lids are  normal.     Conjunctiva/sclera: Conjunctivae normal.  Neck:     Trachea: Trachea normal.     Comments: Trachea midline.  Cardiovascular:     Rate and Rhythm: Normal rate and regular rhythm.     Pulses:          Radial pulses are 1+ on the right side and 1+ on the left side.       Dorsalis pedis pulses are 1+ on the right side and 1+ on the left side.     Heart sounds: Normal heart sounds, S1 normal and S2 normal.     Comments: No murmurs. No LE edema or calf tenderness.  Pulmonary:     Effort: Pulmonary effort is normal.     Breath sounds: Normal breath sounds.  Chest:     Comments: Tenderness in epigastrium/subxiphoid during US Abdominal:     General: Bowel sounds are normal.     Palpations: Abdomen is soft.     Tenderness: There is no abdominal tenderness.     Comments: No epigastric or upper abdominal tenderness.  Musculoskeletal:     Cervical back: Normal range of motion.  Skin:    General: Skin is warm and dry.     Capillary Refill: Capillary refill takes less than 2 seconds.     Comments: No rash to chest wall  Neurological:     Mental Status: He is alert.     GCS: GCS eye subscore is 4. GCS verbal subscore is 5. GCS motor subscore is 6.     Comments: Sensation and strength intact in upper/lower extremities  Psychiatric:        Speech: Speech normal.        Behavior: Behavior normal.        Thought Content: Thought content normal.     ED Results / Procedures / Treatments   Labs (all labs ordered are listed, but only abnormal results are displayed) Labs Reviewed  LIPID PANEL - Abnormal; Notable for the following components:      Result Value   HDL 39 (*)    All other components within normal limits  COMPREHENSIVE METABOLIC PANEL - Abnormal; Notable for the following components:   Glucose, Bld 123 (*)    AST 100 (*)    ALT 69 (*)    All other components within normal limits  TROPONIN I (HIGH  SENSITIVITY) - Abnormal; Notable for the following components:    Troponin I (High Sensitivity) 9,910 (*)    All other components within normal limits  SARS CORONAVIRUS 2 BY RT PCR (HOSPITAL ORDER, [facility])  CBC  HEMOGLOBIN A1C  C-REACTIVE PROTEIN  SEDIMENTATION RATE  TSH  PROTIME-INR  APTT  HIV ANTIBODY (ROUTINE TESTING W REFLEX)  TROPONIN I (HIGH SENSITIVITY)    EKG EKG Interpretation  Date/Time:  Sunday March 09 2020 12:47:23 EDT Ventricular Rate:  66 PR Interval:    QRS Duration: 87 QT Interval:  380 QTC Calculation: 399 R Axis:   92 Text Interpretation: Sinus or ectopic atrial rhythm Borderline right axis deviation LVH by voltage  ST elevation, inferior leads.lateral leads as well Confirmed by Lennice Sites [phone]) on 03/09/2020 1:43:15 PM   Radiology DG Chest Port 1 View  Result Date: 03/09/2020 CLINICAL DATA:  Chest pain and tightness since Tuesday, fever at home, code STEMI EXAM: PORTABLE CHEST 1 VIEW COMPARISON:  Portable exam 1313 hours without priors for comparison FINDINGS: Normal heart size, mediastinal contours, and pulmonary vascularity. Lungs clear. No pleural effusion or pneumothorax. Bones unremarkable. IMPRESSION: No acute abnormalities. Electronically Signed   By: Lavonia Dana M.D.   On: 03/09/2020 13:29    Procedures .Critical Care Performed by: Kinnie Feil, PA-C Authorized by: Kinnie Feil, PA-C   Critical care provider statement:    Critical care time (minutes):  45   Critical care was necessary to treat or prevent imminent or life-threatening deterioration of the following conditions:  Cardiac failure   Critical care was time spent personally by me on the following activities:  Discussions with consultants, evaluation of patient's response to treatment, examination of patient, ordering and performing treatments and interventions, ordering and review of laboratory studies, ordering and review of radiographic studies, pulse oximetry, re-evaluation of patient's condition, obtaining  history from patient or surrogate and review of old charts   I assumed direction of critical care for this patient from another provider in my specialty: no     (including critical care time)  Medications Ordered in ED Medications  colchicine tablet 0.6 mg (has no administration in time range)  ibuprofen (ADVIL) tablet 800 mg (has no administration in time range)  nitroGLYCERIN (NITROSTAT) SL tablet 0.4 mg (has no administration in time range)  acetaminophen (TYLENOL) tablet 650 mg (has no administration in time range)  ondansetron (ZOFRAN) injection 4 mg (has no administration in time range)  enoxaparin (LOVENOX) injection 40 mg (has no administration in time range)  sodium chloride flush (NS) 0.9 % injection 3 mL (3 mLs Intravenous Given 03/09/20 1303)  aspirin chewable tablet 324 mg (324 mg Oral Given 03/09/20 1315)  ketorolac (TORADOL) 30 MG/ML injection 30 mg (30 mg Intravenous Given 03/09/20 1314)    ED Course  I have reviewed the triage vital signs and the nursing notes.  Pertinent labs & imaging results that were available during my care of the patient were reviewed by me and considered in my medical decision making (see chart for details).  Clinical Course as of Mar 09 1345  Sun Mar 09, 2020  1344 Troponin I (High Sensitivity)(!!): 3,903 [CG]    Clinical Course User Index [CG] Kinnie Feil, PA-C   MDM Rules/Calculators/A&P                       This patient complains of chest pain. Recent fever, chills, sore throat, cough. COVID positive  April 2021, felt fine since. Currently having 9/10 chest pain.   I obtained additional history from father at bedside. Previous medical records available, nursing notes reviewed to obtain more history and assist with MDM.  He came to ER 6/3 but LWBS.   Chief complain involves an extensive number of treatment options and is a complaint that carries with it a high risk of complications and morbidity and mortality.   The differential  diagnosis includes MI, myocarditis, pericarditis. Favoring pericarditis/myocarditis given prodrome. Bedside US by EDP shows decreased EF, no pericardial effusion.  I ordered medication including aspirin, toradol per cardiology request.   I ordered chest pain/MI focused laboratory studies including troponin, ESR, sed rate.   I ordered imaging studies including chest x-ray. Bedside US by EDP Curatolo.   I ordered, reviewed and personally visualized and interpreted the above labs and/or imaging studies.  ER work up reveals STE in inferior and lateral leads with reciprocal changes.  Troponin 9910. CXR non acute.  Consulted STEMI team who evaluated patient soon after arrival to ER.   Critical interventions included admission pending troponin, ESR, sed rate, possible LHC.   I have re-evaluated the patient and chest pain unchanged after toradol, aspirin.   1345: ?myocarditis. Patient taken to [facility], admit to CCU.   Final Clinical Impression(s) / ED Diagnoses Final diagnoses:  Abnormal EKG    Rx / DC Orders ED Discharge Orders    None         Kinnie Feil, PA-C 03/09/20 [place], [place], DO 03/09/20 1347

## 2020-03-09 NOTE — Progress Notes (Signed)
Chaplain responded to STEMi page.  Chaplain checked in with nurse and was told patient was already in cath lab. Chaplain available as needed. Rev. Lynnell Chad Pager [phone]

## 2020-03-09 NOTE — ED Triage Notes (Signed)
Pt c.o chest tightness since Tuesday, fever at home, here 3 days ago but LWBS after triage. Pt a.o, code STEMI paged in triage

## 2020-03-10 ENCOUNTER — Inpatient Hospital Stay (HOSPITAL_COMMUNITY): Payer: Medicaid Other

## 2020-03-10 DIAGNOSIS — I514 Myocarditis, unspecified: Secondary | ICD-10-CM

## 2020-03-10 DIAGNOSIS — I5021 Acute systolic (congestive) heart failure: Secondary | ICD-10-CM

## 2020-03-10 DIAGNOSIS — R079 Chest pain, unspecified: Secondary | ICD-10-CM

## 2020-03-10 LAB — BASIC METABOLIC PANEL
Anion gap: 10 (ref 5–15)
BUN: 9 mg/dL (ref 6–20)
CO2: 27 mmol/L (ref 22–32)
Calcium: 9.2 mg/dL (ref 8.9–10.3)
Chloride: 104 mmol/L (ref 98–111)
Creatinine, Ser: 1.04 mg/dL (ref 0.61–1.24)
GFR calc Af Amer: >60 mL/min (ref >60)
GFR calc non Af Amer: >60 mL/min (ref >60)
Glucose, Bld: 102 mg/dL — ABNORMAL HIGH (ref 70–99)
Potassium: 4.5 mmol/L (ref 3.5–5.1)
Sodium: 141 mmol/L (ref 135–145)

## 2020-03-10 LAB — RESPIRATORY PANEL BY PCR

## 2020-03-10 LAB — ECHOCARDIOGRAM COMPLETE
Height: 71 in
Weight: 2402.13 oz

## 2020-03-10 LAB — POCT I-STAT EG7
Acid-Base Excess: 1 mmol/L (ref 0.0–2.0)
Acid-Base Excess: 1 mmol/L (ref 0.0–2.0)
Bicarbonate: 26.7 mmol/L (ref 20.0–28.0)
Bicarbonate: 26.9 mmol/L (ref 20.0–28.0)
Calcium, Ion: 1.25 mmol/L (ref 1.15–1.40)
Calcium, Ion: 1.23 mmol/L (ref 1.15–1.40)
HCT: 43 % (ref 39.0–52.0)
HCT: 43 % (ref 39.0–52.0)
Hemoglobin: 14.6 g/dL (ref 13.0–17.0)
Hemoglobin: 14.6 g/dL (ref 13.0–17.0)
O2 Saturation: 79 %
O2 Saturation: 80 %
Potassium: 3.5 mmol/L (ref 3.5–5.1)
Potassium: 3.5 mmol/L (ref 3.5–5.1)
Sodium: 142 mmol/L (ref 135–145)
Sodium: 143 mmol/L (ref 135–145)
TCO2: 28 mmol/L (ref 22–32)
TCO2: 28 mmol/L (ref 22–32)
pCO2, Ven: 45 mmHg (ref 44.0–60.0)
pCO2, Ven: 45.5 mmHg (ref 44.0–60.0)
pH, Ven: 7.381 (ref 7.250–7.430)
pH, Ven: 7.38 (ref 7.250–7.430)
pO2, Ven: 45 mmHg (ref 32.0–45.0)
pO2, Ven: 45 mmHg (ref 32.0–45.0)

## 2020-03-10 LAB — POCT I-STAT 7, (LYTES, BLD GAS, ICA,H+H)
Acid-Base Excess: 1 mmol/L (ref 0.0–2.0)
Bicarbonate: 26.1 mmol/L (ref 20.0–28.0)
Calcium, Ion: 1.23 mmol/L (ref 1.15–1.40)
HCT: 42 % (ref 39.0–52.0)
Hemoglobin: 14.3 g/dL (ref 13.0–17.0)
O2 Saturation: 100 %
Potassium: 3.5 mmol/L (ref 3.5–5.1)
Sodium: 142 mmol/L (ref 135–145)
TCO2: 27 mmol/L (ref 22–32)
pCO2 arterial: 41.3 mmHg (ref 32.0–48.0)
pH, Arterial: 7.409 (ref 7.350–7.450)
pO2, Arterial: 220 mmHg — ABNORMAL HIGH (ref 83.0–108.0)

## 2020-03-10 MED ORDER — GADOBUTROL 1 MMOL/ML IV SOLN
10.0000 mL | Freq: Once | INTRAVENOUS | Status: AC | PRN
  Administered 2020-03-10: 10 mL via INTRAVENOUS

## 2020-03-10 MED ORDER — CHLORHEXIDINE GLUCONATE CLOTH 2 % EX PADS
6.0000 | MEDICATED_PAD | Freq: Every day | CUTANEOUS | Status: DC
  Administered 2020-03-10: 6 via TOPICAL

## 2020-03-10 MED ORDER — ALPRAZOLAM 0.25 MG PO TABS
0.2500 mg | ORAL_TABLET | Freq: Once | ORAL | Status: AC
  Administered 2020-03-10: 0.25 mg via ORAL
  Filled 2020-03-10: qty 1

## 2020-03-10 MED ORDER — CARVEDILOL 3.125 MG PO TABS
3.1250 mg | ORAL_TABLET | Freq: Two times a day (BID) | ORAL | Status: DC
  Administered 2020-03-10 – 2020-03-11 (×2): 3.125 mg via ORAL
  Filled 2020-03-10 (×2): qty 1

## 2020-03-10 NOTE — Progress Notes (Signed)
  Echocardiogram 2D Echocardiogram has been performed.  Rice Walsh G Jani Ploeger 03/10/2020, 10:06 AM

## 2020-03-10 NOTE — Plan of Care (Signed)

## 2020-03-10 NOTE — Consult Note (Addendum)
Advanced Heart Failure Team Consult Note   Primary Physician: Pediatrics, [place] PCP-Cardiologist:  No primary care provider on file.  Reason for Consultation: Myocarditis/Heart Failure  HPI:    Jesus Holmes is seen today for evaluation of heart failure at the request of Dr Syliva Overman.   Jesus Holmes is a 19 year old with a history of COVID in April of this year. No previous coronary disease.    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  He developed body aches, sore throat, and headache. He went to [facility] on 03/06/20 and waited 7 hours and left without being seen. Yesterday develped chest pain radiating down his left ar.   Presented to [facility] with chest pain radiating down left arm.  Pertinent admission labs: WBC 6.4 . HS Trop 4098>11914, HIV NR, SARs 2 negative,  TSH 1.7 , and CRP 14.2  Had cath with normal coronaries, EF 35%, and preserved cardiac output. Started on ibuprofen, colchicine, and protonix.   Today he feels ok. Denies chest pain. Denies shortness of breath.   RHC/LHC 03/09/2020  Normal Coronaries  EF 35%.  LVEDP 11 RA  4 CO 5.9  CI 2.9   Echo pending.   Review of Systems: [y] = yes, [ ]  = no   . General: Weight gain [ ] ; Weight loss [ ] ; Anorexia [ ] ; Fatigue [ Y]; Fever [ ] ; Chills [ ] ; Weakness [ ]   . Cardiac: Chest pain/pressure [Y ]; Resting SOB [ ] ; Exertional SOB [ ] ; Orthopnea [ ] ; Pedal Edema [ ] ; Palpitations [ ] ; Syncope [ ] ; Presyncope [ ] ; Paroxysmal nocturnal dyspnea[ ]   . Pulmonary: Cough [ ] ; Wheezing[ ] ; Hemoptysis[ ] ; Sputum [ ] ; Snoring [ ]   . GI: Vomiting[ ] ; Dysphagia[ ] ; Melena[ ] ; Hematochezia [ ] ; Heartburn[ ] ; Abdominal pain [ ] ; Constipation [ ] ; Diarrhea [ ] ; BRBPR [ ]   . GU: Hematuria[ ] ; Dysuria [ ] ; Nocturia[ ]   . Vascular: Pain in legs with walking [ ] ; Pain in feet with lying flat [ ] ; Non-healing sores [ ] ; Stroke [ ] ; TIA [ ] ; Slurred speech [ ] ;  . Neuro: Headaches[ ] ; Vertigo[ ] ; Seizures[ ] ; Paresthesias[  ];Blurred vision [ ] ; Diplopia [ ] ; Vision changes [ ]   . Ortho/Skin: Arthritis [ ] ; Joint pain [ ] ; Muscle pain [ ] ; Joint swelling [ ] ; Back Pain [ ] ; Rash [ ]   . Psych: Depression[ ] ; Anxiety[ ]   . Heme: Bleeding problems [ ] ; Clotting disorders [ ] ; Anemia [ ]   . Endocrine: Diabetes [ ] ; Thyroid dysfunction[ ]   Home Medications Prior to Admission medications   Medication Sig Start Date End Date Taking? Authorizing Provider  acetaminophen (TYLENOL) 500 MG tablet Take 1,000 mg by mouth every 6 (six) hours as needed for headache (pain).   Yes [provider]  ibuprofen (ADVIL) 200 MG tablet Take 400 mg by mouth every 6 (six) hours as needed for headache (pain).   Yes [provider]    Past Medical History: Past Medical History:  Diagnosis Date  . COVID-19 virus detected    a. positive for COVID -19 in 01/2020.    Past Surgical History: Past Surgical History:  Procedure Laterality Date  . RIGHT/LEFT HEART CATH AND CORONARY ANGIOGRAPHY N/A 03/09/2020   Procedure: RIGHT/LEFT HEART CATH AND CORONARY ANGIOGRAPHY;  Surgeon: , MD;  Location: [facility] INVASIVE CV LAB;  Service: Cardiovascular;  Laterality: N/A;    Family History: Family  History  Problem Relation Age of Onset  . Sleep apnea Father   . Cancer Maternal Grandmother        ovarian  . Diabetes Maternal Grandmother   . Cancer Maternal Grandfather        prostate  . Diabetes Maternal Grandfather   . Diabetes Paternal Grandmother   . Hyperlipidemia Paternal Grandmother   . Hypertension Paternal Grandmother   . Hypertension Paternal Grandfather   . Hyperlipidemia Paternal Grandfather   . Heart disease Paternal Grandfather   . Hearing loss Neg Hx   . Early death Neg Hx   . Drug abuse Neg Hx   . Depression Neg Hx   . COPD Neg Hx   . Birth defects Neg Hx   . Asthma Neg Hx   . Arthritis Neg Hx   . Alcohol abuse Neg Hx   . Kidney disease Neg Hx   . Learning disabilities Neg Hx   . Mental  retardation Neg Hx   . Miscarriages / Stillbirths Neg Hx   . Stroke Neg Hx   . Vision loss Neg Hx   . Varicose Veins Neg Hx     Social History: Social History   Socioeconomic History  . Marital status: Single    Spouse name: Not on file  . Number of children: Not on file  . Years of education: Not on file  . Highest education level: Not on file  Occupational History  . Not on file  Tobacco Use  . Smoking status: Never Smoker  . Smokeless tobacco: Never Used  Substance and Sexual Activity  . Alcohol use: No  . Drug use: No  . Sexual activity: Never  Other Topics Concern  . Not on file  Social History Narrative   Jesus Holmes in [employer]   freshman   Social Determinants of Health   Financial Resource Strain:   . Difficulty of Paying Living Expenses:   Food Insecurity:   . Worried About Programme researcher, broadcasting/film/video in the Last Year:   . Barista in the Last Year:   Transportation Needs:   . Freight forwarder (Medical):   Marland Kitchen Lack of Transportation (Non-Medical):   Physical Activity:   . Days of Exercise per Week:   . Minutes of Exercise per Session:   Stress:   . Feeling of Stress :   Social Connections:   . Frequency of Communication with Friends and Family:   . Frequency of Social Gatherings with Friends and Family:   . Attends Religious Services:   . Active Member of Clubs or Organizations:   . Attends Banker Meetings:   Marland Kitchen Marital Status:     Allergies:  No Known Allergies  Objective:    Vital Signs:   Temp:  [98 F (36.7 C)-98.4 F (36.9 C)] 98 F (36.7 C) (06/07 0103) Pulse Rate:  [68-85] 71 (06/07 0400) Resp:  [13-25] 19 (06/07 0700) BP: (89-143)/(54-98) 89/56 (06/07 0700) SpO2:  [93 %-100 %] 100 % (06/07 0600) Weight:  [68.1 kg-81.6 kg] 68.1 kg (06/07 0500) Last BM Date: 03/08/20  Weight change: Filed Weights   03/09/20 1233 03/09/20 1254 03/10/20 0500  Weight: 81.6 kg 81.6 kg 68.1 kg     Intake/Output:   Intake/Output Summary (Last 24 hours) at 03/10/2020 0815 Last data filed at 03/09/2020 2000 Gross per 24 hour  Intake 240 ml  Output --  Net 240 ml      Physical Exam    General:  Well appearing. No resp difficulty HEENT: normal Neck: supple. JVP flat . Carotids 2+ bilat; no bruits. No lymphadenopathy or thyromegaly appreciated. Cor: PMI nondisplaced. Regular rate & rhythm. No rubs, gallops or murmurs. Lungs: clear Abdomen: soft, nontender, nondistended. No hepatosplenomegaly. No bruits or masses. Good bowel sounds. Extremities: no cyanosis, clubbing, rash, edema Neuro: alert & orientedx3, cranial nerves grossly intact. moves all 4 extremities w/o difficulty. Affect pleasant   Telemetry   NSR 70-80s   EKG    On admit SR with ST elevation noted 2,3 AVR, V4,5,6. ST depression AVR  Labs   Basic Metabolic Panel: Recent Labs  Lab 03/06/20 2011 03/09/20 1257 03/10/20 0318  NA 136 138 141  K 3.7 3.6 4.5  CL 100 103 104  CO2 23 22 27   GLUCOSE 95 123* 102*  BUN 6 8 9   CREATININE 1.18 1.01 1.04  CALCIUM 9.6 9.3 9.2    Liver Function Tests: Recent Labs  Lab 03/09/20 1257  AST 100*  ALT 69*  ALKPHOS 94  BILITOT 1.1  PROT 7.0  ALBUMIN 3.7   No results for input(s): LIPASE, AMYLASE in the last 168 hours. No results for input(s): AMMONIA in the last 168 hours.  CBC: Recent Labs  Lab 03/06/20 2011 03/09/20 1246  WBC 6.3 6.4  NEUTROABS 4.8  --   HGB 16.4 15.8  HCT 49.0 46.3  MCV 89.3 86.5  PLT 140* 152    Cardiac Enzymes: No results for input(s): CKTOTAL, CKMB, CKMBINDEX, TROPONINI in the last 168 hours.  BNP: BNP (last 3 results) No results for input(s): BNP in the last 8760 hours.  ProBNP (last 3 results) No results for input(s): PROBNP in the last 8760 hours.   CBG: No results for input(s): GLUCAP in the last 168 hours.  Coagulation Studies: Recent Labs    03/09/20 1555  LABPROT 14.2  INR 1.2     Imaging    CARDIAC CATHETERIZATION  Result Date: 03/09/2020 Normal right heart pressures. Normal coronary arteries. Moderate global LV dysfunction with EF estimate approximately 35%; LVEDP 11 mmHg. RECOMMENDATION: The patient will be admitted to to heart.  He will undergo further evaluation and treatment for probable myocarditis.  DG Chest Port 1 View  Result Date: 03/09/2020 CLINICAL DATA:  Chest pain and tightness since Tuesday, fever at home, code STEMI EXAM: PORTABLE CHEST 1 VIEW COMPARISON:  Portable exam 1313 hours without priors for comparison FINDINGS: Normal heart size, mediastinal contours, and pulmonary vascularity. Lungs clear. No pleural effusion or pneumothorax. Bones unremarkable. IMPRESSION: No acute abnormalities. Electronically Signed   By: Lavonia Dana M.D.   On: 03/09/2020 13:29      Medications:     Current Medications: . colchicine  0.6 mg Oral BID  . enoxaparin (LOVENOX) injection  40 mg Subcutaneous Q24H  . ibuprofen  800 mg Oral TID  . pantoprazole  40 mg Oral Daily     Infusions:   Assessment/Plan  1. Chest Pain , suspected Myopericarditis.  - Check respiratory panel. COVID-19 negative. HIV negative.   HS Trop elevated 8413>24401 &- CRP 14.2  . Will need CRP in 1 week.  - LHC normal coronaries, preserved cardiac output, EF ~35% - ECHO 45-50% today per Dr Aundra Dubin.  - Needs CMRI today. He is concerned about claustrophobia.  Given dose of xanax.  - Continue colchicine 0.6 mg twice a day x 3 months  - Continue ibuprofen 800 mg tid x 1-2 weeks - Continue pantoprazole 40 mg daily while on ibuprofen.  -  Needs to avoid strenuous activities at least 3 months. 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  2. H/O COVID 01/2020   Transfer to progressive care.   Length of Stay: 1  Amy Clegg, NP  03/10/2020, 8:15 AM  Advanced Heart Failure Team Pager [phone] (M-F; 7a - 4p)   Please contact [facility] for night-coverage after hours (4p -7a ) and weekends on [email or web address]  Patient seen with NP, agree with the above note.   Patient had COVID-19 back in April, but symptoms had resolved weeks ago.  Last week, he developed headache, sore throat, and body aches.  On Saturday night, he developed severe substernal CP.  He came to the ER on Sunday.  He had marked inferior and anterolateral ST elevation. Troponin was significantly elevated.   He was taken for cath, this showed no CAD and normal cardiac output and filling pressures.  EF 30-35% on LV-gram.   This morning, chest pain is resolved.  He is now on colchicine and ibuprofen.  I reviewed his echo done this morning, EF in the 45-50% range with normal RV.    General: NAD Neck: No JVD, no thyromegaly or thyroid nodule.  Lungs: Clear to auscultation bilaterally with normal respiratory effort. CV: Nondisplaced PMI.  Heart regular S1/S2, no S3/S4, no murmur, no friction rub.  No peripheral edema.  No carotid bruit.  Normal pedal pulses.  Abdomen: Soft, nontender, no hepatosplenomegaly, no distention.  Skin: Intact without lesions or rashes.  Neurologic: Alert and oriented x 3.  Psych: Normal affect. Extremities: No clubbing or cyanosis.  HEENT: Normal.   1. Myopericarditis: I suspect that the current episode is myopericarditis.  ECG with inferior and anterolateral STE.  Typical chest pain.  Cath with no significant coronary disease and filling pressures normal.  CRP elevated.  Significantly elevated HS-TnI.  Currently, CP is resolved.  LV EF 45-50% by my review of echo with normal RV.   Suspect viral source for myopericarditis.  TSH, HIV, and COVID-19 negative.  - Will send respiratory virus panel.  - Repeat ECG today.  - Continue colchicine 0.6 bid.  - Ibuprofen 800 mg tid + Protonix 40 daily x 2 week course.   - Start Coreg 3.125 mg bid.  - Will add ARB or Entresto depending on BP.  - cardiac MRI to evaluate  myopericarditis.  - Avoid strenuous activity x 6 months.  Would suggest keeping lifting < 30 lbs at work.  2. COVID-19: Negative this admission. Had in 4/21 and symptoms had resolved.   Marca Ancona   03/10/2020 11:19 AM .

## 2020-03-10 NOTE — Discharge Summary (Signed)
Advanced Heart Failure Team  Discharge Summary   Patient ID: Jesus Holmes MRN: [id number], DOB/AGE: 2001/02/04 19 y.o. Admit date: 03/09/2020 D/C date:     03/11/2020   Primary Discharge Diagnoses:  Myopericarditis H/O [facility] Course:  Jesus Holmes is a 19 year old with a history of COVID in April of this year. No previous coronary disease.   Presented to [facility] with chest pain radiating down left arm.  Pertinent admission labs: WBC 6.4 . HS Trop [id number], HIV NR, SARs 2 negative,  TSH 1.7 , and CRP 14.2  Had cath with normal coronaries, EF 35%, and preserved cardiac output. Started on ibuprofen, colchicine, and protonix.   See below for detailed problem list. He will be followed closely in the HF clinic. Plan to check CRP next week.   Provided letter for work. He needs to remain out work for now. No strenuous activity and no lifting over 30 pounds. See below for detailed problem list.    1. Myopericarditis: I suspect that the current episode is myopericarditis.  ECG with inferior and anterolateral STE.  Typical chest pain.  Cath with no significant coronary disease and filling pressures normal.  CRP elevated.  Significantly elevated HS-TnI.  Currently, CP is resolved.  LV EF 45-50% by my review of echo with normal RV.   Suspect viral source for myopericarditis.  TSH, HIV, and COVID-19 negative.  - Respiratory virus panel was negative.  - Continue colchicine 0.6 bid x 3 months  - Ibuprofen 800 mg tid + Protonix 40 daily x 2 week course.   - Continue Coreg 3.125 mg bid.  - Will add ARB or Entresto depending on BP.  - cardiac MRI c/w myopericarditis.  - Avoid strenuous activity x 6 months.  Would suggest keeping lifting < 30 lbs at work.  2. COVID-19: Negative this admission. Had in 4/21 and symptoms had resolved.   Discharge Vitals: Blood pressure (!) 99/58, pulse 72, temperature 97.7 F (36.5 C), temperature source Oral, resp. rate 20, height 5\' 11"  (1.803 m), weight 68.1 kg, SpO2  100 %.  Labs: Lab Results  Component Value Date   WBC 6.4 03/09/2020   HGB 14.3 03/09/2020   HCT 42.0 03/09/2020   MCV 86.5 03/09/2020   PLT 152 03/09/2020    Recent Labs  Lab 03/09/20 1257 03/09/20 1446 03/10/20 0318  NA 138   < > 141  K 3.6   < > 4.5  CL 103   < > 104  CO2 22   < > 27  BUN 8   < > 9  CREATININE 1.01   < > 1.04  CALCIUM 9.3   < > 9.2  PROT 7.0  --   --   BILITOT 1.1  --   --   ALKPHOS 94  --   --   ALT 69*  --   --   AST 100*  --   --   GLUCOSE 123*   < > 102*   < > = values in this interval not displayed.   Lab Results  Component Value Date   CHOL 136 03/09/2020   HDL 39 (L) 03/09/2020   LDLCALC 84 03/09/2020   TRIG 63 03/09/2020   BNP (last 3 results) No results for input(s): BNP in the last 8760 hours.  ProBNP (last 3 results) No results for input(s): PROBNP in the last 8760 hours.   Diagnostic Studies/Procedures   CARDIAC CATHETERIZATION  Result Date: 03/09/2020 Normal right heart pressures. Normal  coronary arteries. Moderate global LV dysfunction with EF estimate approximately 35%; LVEDP 11 mmHg. RECOMMENDATION: The patient will be admitted to to heart.  He will undergo further evaluation and treatment for probable myocarditis.  DG Chest Port 1 View  Result Date: 03/09/2020 CLINICAL DATA:  Chest pain and tightness since Tuesday, fever at home, code STEMI EXAM: PORTABLE CHEST 1 VIEW COMPARISON:  Portable exam 1313 hours without priors for comparison FINDINGS: Normal heart size, mediastinal contours, and pulmonary vascularity. Lungs clear. No pleural effusion or pneumothorax. Bones unremarkable. IMPRESSION: No acute abnormalities. Electronically Signed   By: Ulyses Southward M.D.   On: 03/09/2020 13:29   MR CARDIAC MORPHOLOGY W WO CONTRAST  Result Date: 03/10/2020 CLINICAL DATA:  Myopericarditis EXAM: CARDIAC MRI TECHNIQUE: The patient was scanned on a 1.5 Tesla GE magnet. A dedicated cardiac coil was used. Functional imaging was done using Fiesta  sequences. 2,3, and 4 chamber views were done to assess for RWMA's. Modified Simpson's rule using a short axis stack was used to calculate an ejection fraction on a dedicated work [employer]. The patient received 8 cc of Gadavist. After 10 minutes inversion recovery sequences were used to assess for infiltration and scar tissue. CONTRAST:  Gadavist 8 cc FINDINGS: Limited images of the lung fields showed no gross abnormalities. Trivial pericardial effusion inferiorly. Normal left ventricular size and wall thickness. There was lateral wall hypokinesis, LV EF 51%. Normal right ventricular size and systolic function, EF 45%. Trileaflet aortic valve with no significant regurgitation or stenosis. No significant mitral regurgitation. Delayed enhancement images showed diffuse mid-wall and subepicardial late gadolinium enhancement (LGE) throughout the anterolateral and inferolateral walls. There also appeared to be patchy pericardial enhancement. Measurements: LVEDV 157 LVSV 80 LVEF 51% RVEDV 134 mL RVSV 60 mL RVEF 45% T2 57 in lateral wall IMPRESSION: 1.  Normal LV size with lateral hypokinesis, EF 51%. 2.  Normal RV size and systolic function, EF 45%. 3.  Elevated T2 signal in the lateral wall. 4. Diffuse mid-wall and subepicardial LGE in the anterolateral and inferolateral walls. 5. Patchy pericardial enhancement with trivial inferior pericardial effusion. This study is consistent with acute myopericarditis. Dalton Mclean Electronically Signed   By: Marca Ancona M.D.   On: 03/10/2020 14:50   ECHOCARDIOGRAM COMPLETE  Result Date: 03/10/2020    ECHOCARDIOGRAM REPORT   Patient Name:   Jesus Holmes Date of Exam: 03/10/2020 Medical Rec #:  [id number]        Height:       71.0 in Accession #:    [id number]       Weight:       150.1 lb Date of Birth:  Nov 25, 2000        BSA:          1.867 m Patient Age:    19 years         BP:           107/70 mmHg Patient Gender: M                HR:           91 bpm.  Exam Location:  Inpatient Procedure: 2D Echo, Cardiac Doppler and Color Doppler Indications:    R07.9* Chest pain, unspecified  History:        Patient has no prior history of Echocardiogram examinations.                 COVID-19.  Sonographer:    Advertising copywriter Referring  Phys: [id number] [place]  1. Left ventricular ejection fraction, by estimation, is 55 to 60%. The left ventricle has normal function. The left ventricle has no regional wall motion abnormalities. Left ventricular diastolic parameters were normal.  2. Right ventricular systolic function is normal. The right ventricular size is normal.  3. The mitral valve is grossly normal. No evidence of mitral valve regurgitation.  4. The aortic valve is tricuspid. Aortic valve regurgitation is not visualized.  5. The inferior vena cava is normal in size with greater than 50% respiratory variability, suggesting right atrial pressure of 3 mmHg. Conclusion(s)/Recommendation(s): Normal biventricular function without evidence of hemodynamically significant valvular heart disease. FINDINGS  Left Ventricle: Left ventricular ejection fraction, by estimation, is 55 to 60%. The left ventricle has normal function. The left ventricle has no regional wall motion abnormalities. The left ventricular internal cavity size was normal in size. There is  borderline left ventricular hypertrophy. Left ventricular diastolic parameters were normal. Right Ventricle: The right ventricular size is normal. No increase in right ventricular wall thickness. Right ventricular systolic function is normal. Left Atrium: Left atrial size was normal in size. Right Atrium: Right atrial size was normal in size. Pericardium: There is no evidence of pericardial effusion. Mitral Valve: The mitral valve is grossly normal. No evidence of mitral valve regurgitation. Tricuspid Valve: The tricuspid valve is grossly normal. Tricuspid valve regurgitation is not demonstrated. Aortic Valve: The  aortic valve is tricuspid. Aortic valve regurgitation is not visualized. Pulmonic Valve: The pulmonic valve was normal in structure. Pulmonic valve regurgitation is trivial. Aorta: The aortic root and ascending aorta are structurally normal, with no evidence of dilitation. Venous: The inferior vena cava is normal in size with greater than 50% respiratory variability, suggesting right atrial pressure of 3 mmHg. IAS/Shunts: No atrial level shunt detected by color flow Doppler.  LEFT VENTRICLE PLAX 2D LVIDd:         4.29 cm  Diastology LVIDs:         3.00 cm  LV e' lateral:   12.20 cm/s LV PW:         0.95 cm  LV E/e' lateral: 8.1 LV IVS:        0.88 cm  LV e' medial:    10.70 cm/s LVOT diam:     2.00 cm  LV E/e' medial:  9.2 LV SV:         48 LV SV Index:   26 LVOT Area:     3.14 cm  RIGHT VENTRICLE             IVC RV Basal diam:  2.59 cm     IVC diam: 1.88 cm RV S prime:     10.20 cm/s TAPSE (M-mode): 1.2 cm LEFT ATRIUM             Index       RIGHT ATRIUM           Index LA diam:        2.90 cm 1.55 cm/m  RA Area:     13.00 cm LA Vol (A2C):   48.3 ml 25.88 ml/m RA Volume:   31.70 ml  16.98 ml/m LA Vol (A4C):   26.1 ml 13.98 ml/m LA Biplane Vol: 37.2 ml 19.93 ml/m  AORTIC VALVE LVOT Vmax:   71.70 cm/s LVOT Vmean:  48.400 cm/s LVOT VTI:    0.153 m  AORTA Ao Root diam: 2.80 cm Ao Asc diam:  2.60 cm MITRAL VALVE MV  Area (PHT): 4.80 cm    SHUNTS MV Decel Time: 158 msec    Systemic VTI:  0.15 m MV E velocity: 98.50 cm/s  Systemic Diam: 2.00 cm MV A velocity: 46.10 cm/s MV E/A ratio:  2.14 Zoila Shutter MD Electronically signed by Zoila Shutter MD Signature Date/Time: 03/10/2020/11:29:12 AM    Final     Discharge Medications   Allergies as of 03/11/2020   No Known Allergies     Medication List    TAKE these medications   acetaminophen 500 MG tablet Commonly known as: TYLENOL Take 1,000 mg by mouth every 6 (six) hours as needed for headache (pain).   carvedilol 3.125 MG tablet Commonly known as:  COREG Take 1 tablet (3.125 mg total) by mouth 2 (two) times daily with a meal.   colchicine 0.6 MG tablet Take 1 tablet (0.6 mg total) by mouth 2 (two) times daily.   ibuprofen 800 MG tablet Commonly known as: ADVIL Take 1 tablet (800 mg total) by mouth 3 (three) times daily. What changed:   medication strength  how much to take  when to take this  reasons to take this   pantoprazole 40 MG tablet Commonly known as: PROTONIX Take 1 tablet (40 mg total) by mouth daily. Start taking on: March 12, 2020       Disposition   The patient will be discharged in stable condition to home. Discharge Instructions    Avoid straining   Complete by: As directed    No strenuous activity x 6 months. No lifting over 30 pounds.   Diet - low sodium heart healthy   Complete by: As directed    Increase activity slowly   Complete by: As directed      Follow-up Information    [facility] Follow up on 03/20/2020.   Specialty: Cardiology Why: at [address]  Contact information: [address] [id number] Wilhemina Bonito [place] [address] [phone]            Duration of Discharge Encounter: Greater than 35 minutes   Signed, Cassadi Purdie NP-C  03/11/2020, 9:02 AM

## 2020-03-11 DIAGNOSIS — B3322 Viral myocarditis: Secondary | ICD-10-CM

## 2020-03-11 MED ORDER — IBUPROFEN 800 MG PO TABS: 800.0000 mg | ORAL_TABLET | Freq: Three times a day (TID) | ORAL | 0 refills | Status: DC

## 2020-03-11 MED ORDER — CARVEDILOL 3.125 MG PO TABS: 3.1250 mg | ORAL_TABLET | Freq: Two times a day (BID) | ORAL | 6 refills | Status: DC

## 2020-03-11 MED ORDER — PANTOPRAZOLE SODIUM 40 MG PO TBEC: 40.0000 mg | DELAYED_RELEASE_TABLET | Freq: Every day | ORAL | 0 refills | Status: DC

## 2020-03-11 MED ORDER — COLCHICINE 0.6 MG PO TABS: 0.6000 mg | ORAL_TABLET | Freq: Two times a day (BID) | ORAL | 2 refills | Status: DC

## 2020-03-11 NOTE — Progress Notes (Addendum)
Advanced Heart Failure Rounding Note  PCP-Cardiologist: No primary care provider on file.   Subjective:    Admitted w/ myopericarditis. Had recent COVID 19 infection 4/21.  - Echo w/ LVEF 45-50%. RV ok.  - Hs Trop 9,910>>24,304 - LCH w/ no significant coronary disease and normal filling pressures.  - CRP elevated at 14.  - cMRI c/w acute myopericarditis.   Started on colchicine and ibuprofen w/ improvement of symptoms. Currently CP free. Denies dyspnea. No arrhthymias on tele. Father at bedside.     cMRI 03/10/20 IMPRESSION: 1.  Normal LV size with lateral hypokinesis, EF 51%.   2.  Normal RV size and systolic function, EF 45%.   3.  Elevated T2 signal in the lateral wall.   4. Diffuse mid-wall and subepicardial LGE in the anterolateral and inferolateral walls.   5. Patchy pericardial enhancement with trivial inferior pericardial effusion.   This study is consistent with acute myopericarditis.    Objective:   Weight Range: 68.1 kg Body mass index is 20.93 kg/m.   Vital Signs:   Temp:  [97.8 F (36.6 C)-98.2 F (36.8 C)] 98.1 F (36.7 C) (06/08 0439) Pulse Rate:  [61-78] 61 (06/08 0439) Resp:  [16-36] 18 (06/08 0439) BP: (94-113)/(65-87) 100/71 (06/08 0439) SpO2:  [86 %-100 %] 100 % (06/08 0439) Weight:  [68.1 kg] 68.1 kg (06/08 0031) Last BM Date: 03/10/20  Weight change: Filed Weights   03/09/20 1254 03/10/20 0500 03/11/20 0031  Weight: 81.6 kg 68.1 kg 68.1 kg    Intake/Output:   Intake/Output Summary (Last 24 hours) at 03/11/2020 0723 Last data filed at 03/11/2020 0444 Gross per 24 hour  Intake 852 ml  Output 200 ml  Net 652 ml      Physical Exam    General:  Well appearing young AAM. No resp difficulty HEENT: Normal Neck: Supple. No JVP . Carotids 2+ bilat; no bruits. No lymphadenopathy or thyromegaly appreciated. Cor: PMI nondisplaced. Regular rate & rhythm. No rubs, gallops or murmurs. Lungs: Clear Abdomen: Soft, nontender,  nondistended. No hepatosplenomegaly. No bruits or masses. Good bowel sounds. Extremities: No cyanosis, clubbing, rash, edema Neuro: Alert & orientedx3, cranial nerves grossly intact. moves all 4 extremities w/o difficulty. Affect pleasant   Telemetry   NSR low 70s   EKG    Sinus bradycardia 58 bpm w/ inferolateral TWIs   Labs    CBC Recent Labs    03/09/20 1246 03/09/20 1446 03/09/20 1447 03/09/20 1454  WBC 6.4  --   --   --   HGB 15.8   < > 14.6 14.3  HCT 46.3   < > 43.0 42.0  MCV 86.5  --   --   --   PLT 152  --   --   --    < > = values in this interval not displayed.   Basic Metabolic Panel Recent Labs    76/81/15 1257 03/09/20 1446 03/09/20 1454 03/10/20 0318  NA 138   < > 142 141  K 3.6   < > 3.5 4.5  CL 103  --   --  104  CO2 22  --   --  27  GLUCOSE 123*  --   --  102*  BUN 8  --   --  9  CREATININE 1.01  --   --  1.04  CALCIUM 9.3  --   --  9.2   < > = values in this interval not displayed.   Liver Function Tests  Recent Labs    03/09/20 1257  AST 100*  ALT 69*  ALKPHOS 94  BILITOT 1.1  PROT 7.0  ALBUMIN 3.7   No results for input(s): LIPASE, AMYLASE in the last 72 hours. Cardiac Enzymes No results for input(s): CKTOTAL, CKMB, CKMBINDEX, TROPONINI in the last 72 hours.  BNP: BNP (last 3 results) No results for input(s): BNP in the last 8760 hours.  ProBNP (last 3 results) No results for input(s): PROBNP in the last 8760 hours.   D-Dimer No results for input(s): DDIMER in the last 72 hours. Hemoglobin A1C Recent Labs    03/09/20 1246  HGBA1C 5.4   Fasting Lipid Panel Recent Labs    03/09/20 1257  CHOL 136  HDL 39*  LDLCALC 84  TRIG 63  CHOLHDL 3.5   Thyroid Function Tests Recent Labs    03/09/20 1316  TSH 1.750    Other results:   Imaging    MR CARDIAC MORPHOLOGY W WO CONTRAST  Result Date: 03/10/2020 CLINICAL DATA:  Myopericarditis EXAM: CARDIAC MRI TECHNIQUE: The patient was scanned on a 1.5 Tesla GE magnet.  A dedicated cardiac coil was used. Functional imaging was done using Fiesta sequences. 2,3, and 4 chamber views were done to assess for RWMA's. Modified Simpson's rule using a short axis stack was used to calculate an ejection fraction on a dedicated work [employer] officer, [employer]. The patient received 8 cc of Gadavist. After 10 minutes inversion recovery sequences were used to assess for infiltration and scar tissue. CONTRAST:  Gadavist 8 cc FINDINGS: Limited images of the lung fields showed no gross abnormalities. Trivial pericardial effusion inferiorly. Normal left ventricular size and wall thickness. There was lateral wall hypokinesis, LV EF 51%. Normal right ventricular size and systolic function, EF 45%. Trileaflet aortic valve with no significant regurgitation or stenosis. No significant mitral regurgitation. Delayed enhancement images showed diffuse mid-wall and subepicardial late gadolinium enhancement (LGE) throughout the anterolateral and inferolateral walls. There also appeared to be patchy pericardial enhancement. Measurements: LVEDV 157 LVSV 80 LVEF 51% RVEDV 134 mL RVSV 60 mL RVEF 45% T2 57 in lateral wall IMPRESSION: 1.  Normal LV size with lateral hypokinesis, EF 51%. 2.  Normal RV size and systolic function, EF 45%. 3.  Elevated T2 signal in the lateral wall. 4. Diffuse mid-wall and subepicardial LGE in the anterolateral and inferolateral walls. 5. Patchy pericardial enhancement with trivial inferior pericardial effusion. This study is consistent with acute myopericarditis. Madison Direnzo Electronically Signed   By: Marca Ancona M.D.   On: 03/10/2020 14:50   ECHOCARDIOGRAM COMPLETE  Result Date: 03/10/2020    ECHOCARDIOGRAM REPORT   Patient Name:   Jesus Holmes Date of Exam: 03/10/2020 Medical Rec #:  [id number]        Height:       71.0 in Accession #:    [id number]       Weight:       150.1 lb Date of Birth:  09-Dec-2000        BSA:          1.867 m Patient Age:    19 years         BP:            107/70 mmHg Patient Gender: M                HR:           91 bpm. Exam Location:  Inpatient Procedure: 2D Echo, Cardiac Doppler and Color  Doppler Indications:    R07.9* Chest pain, unspecified  History:        Patient has no prior history of Echocardiogram examinations.                 COVID-19.  Sonographer:    Tiffany Dance Referring Phys: [id number] [place]  1. Left ventricular ejection fraction, by estimation, is 55 to 60%. The left ventricle has normal function. The left ventricle has no regional wall motion abnormalities. Left ventricular diastolic parameters were normal.  2. Right ventricular systolic function is normal. The right ventricular size is normal.  3. The mitral valve is grossly normal. No evidence of mitral valve regurgitation.  4. The aortic valve is tricuspid. Aortic valve regurgitation is not visualized.  5. The inferior vena cava is normal in size with greater than 50% respiratory variability, suggesting right atrial pressure of 3 mmHg. Conclusion(s)/Recommendation(s): Normal biventricular function without evidence of hemodynamically significant valvular heart disease. FINDINGS  Left Ventricle: Left ventricular ejection fraction, by estimation, is 55 to 60%. The left ventricle has normal function. The left ventricle has no regional wall motion abnormalities. The left ventricular internal cavity size was normal in size. There is  borderline left ventricular hypertrophy. Left ventricular diastolic parameters were normal. Right Ventricle: The right ventricular size is normal. No increase in right ventricular wall thickness. Right ventricular systolic function is normal. Left Atrium: Left atrial size was normal in size. Right Atrium: Right atrial size was normal in size. Pericardium: There is no evidence of pericardial effusion. Mitral Valve: The mitral valve is grossly normal. No evidence of mitral valve regurgitation. Tricuspid Valve: The tricuspid valve is grossly  normal. Tricuspid valve regurgitation is not demonstrated. Aortic Valve: The aortic valve is tricuspid. Aortic valve regurgitation is not visualized. Pulmonic Valve: The pulmonic valve was normal in structure. Pulmonic valve regurgitation is trivial. Aorta: The aortic root and ascending aorta are structurally normal, with no evidence of dilitation. Venous: The inferior vena cava is normal in size with greater than 50% respiratory variability, suggesting right atrial pressure of 3 mmHg. IAS/Shunts: No atrial level shunt detected by color flow Doppler.  LEFT VENTRICLE PLAX 2D LVIDd:         4.29 cm  Diastology LVIDs:         3.00 cm  LV e' lateral:   12.20 cm/s LV PW:         0.95 cm  LV E/e' lateral: 8.1 LV IVS:        0.88 cm  LV e' medial:    10.70 cm/s LVOT diam:     2.00 cm  LV E/e' medial:  9.2 LV SV:         48 LV SV Index:   26 LVOT Area:     3.14 cm  RIGHT VENTRICLE             IVC RV Basal diam:  2.59 cm     IVC diam: 1.88 cm RV S prime:     10.20 cm/s TAPSE (M-mode): 1.2 cm LEFT ATRIUM             Index       RIGHT ATRIUM           Index LA diam:        2.90 cm 1.55 cm/m  RA Area:     13.00 cm LA Vol (A2C):   48.3 ml 25.88 ml/m RA Volume:   31.70 ml  16.98 ml/m LA Vol (  A4C):   26.1 ml 13.98 ml/m LA Biplane Vol: 37.2 ml 19.93 ml/m  AORTIC VALVE LVOT Vmax:   71.70 cm/s LVOT Vmean:  48.400 cm/s LVOT VTI:    0.153 m  AORTA Ao Root diam: 2.80 cm Ao Asc diam:  2.60 cm MITRAL VALVE MV Area (PHT): 4.80 cm    SHUNTS MV Decel Time: 158 msec    Systemic VTI:  0.15 m MV E velocity: 98.50 cm/s  Systemic Diam: 2.00 cm MV A velocity: 46.10 cm/s MV E/A ratio:  2.14 Zoila Shutter MD Electronically signed by Zoila Shutter MD Signature Date/Time: 03/10/2020/11:29:12 AM    Final      Medications:     Scheduled Medications:  carvedilol  3.125 mg Oral BID WC   Chlorhexidine Gluconate Cloth  6 each Topical Daily   colchicine  0.6 mg Oral BID   enoxaparin (LOVENOX) injection  40 mg Subcutaneous Q24H   ibuprofen   800 mg Oral TID   pantoprazole  40 mg Oral Daily    Infusions:    PRN Medications: acetaminophen, nitroGLYCERIN, ondansetron (ZOFRAN) IV    Assessment/Plan   1. Myopericarditis:  ECG with inferior and anterolateral STE.  Typical chest pain.  Cath with no significant coronary disease and filling pressures normal.  CRP elevated.  Significantly elevated HS-TnI. LVEF 45-50% by my review of echo with normal RV. cMRI demonstrates mid-wall and subepicardial LGE in the anterolateral and inferolateral walls and patchy pericardial enhancement with trivial inferior pericardial effusion. Findings c/w acute myopericarditis, Suspect viral source, likely from recent COVID infection. Respiratory panel this admit negative.  TSH, HIV, and COVID-19 negative.  - Currently CP free. No dyspnea  - Continue colchicine 0.6 bid.  - Continue Ibuprofen 800 mg tid + Protonix 40 daily x 2 week course.   - Continue Coreg 3.125 mg bid.  - Doubt BP will tolerate Entresto. If BP ok, can try adding 12.5 of losartan qhs.   - Avoid strenuous activity x 6 months.  Would suggest keeping lifting < 30 lbs at work.  2. COVID-19: Negative this admission. Had in 4/21 and symptoms had resolved.     Length of Stay: 2  Robbie Lis, PA-C  03/11/2020, 7:23 AM  Advanced Heart Failure Team Pager [phone] (M-F; 7a - 4p)  Please contact [facility] for night-coverage after hours (4p -7a ) and weekends on [email or web address]  Agree with the above note.   Patient is now CP-free. Cardiac MRI suggestive of acute myopericarditis as above.   Plan as above, home on Coreg, colchicine x 3 months, Ibuprofen x Protonix x 2 wks.  Avoid strenuous activity x 6 months.  Will make followup in CHF clinic.   Marca Ancona 03/11/2020

## 2020-03-20 ENCOUNTER — Encounter (HOSPITAL_COMMUNITY): Payer: Self-pay

## 2020-03-20 ENCOUNTER — Ambulatory Visit (HOSPITAL_COMMUNITY)
Admit: 2020-03-20 | Discharge: 2020-03-20 | Disposition: A | Discharge status: Home / Self Care | Payer: Medicaid Other | Source: Ambulatory Visit | Attending: Adult Health | Admitting: Adult Health

## 2020-03-20 ENCOUNTER — Other Ambulatory Visit: Payer: Self-pay

## 2020-03-20 VITALS — BP 112/78 | HR 59 | Ht 71.0 in | Wt 154.0 lb

## 2020-03-20 DIAGNOSIS — Z8616 Personal history of COVID-19: Secondary | ICD-10-CM | POA: Insufficient documentation

## 2020-03-20 DIAGNOSIS — R001 Bradycardia, unspecified: Secondary | ICD-10-CM | POA: Insufficient documentation

## 2020-03-20 DIAGNOSIS — B3322 Viral myocarditis: Secondary | ICD-10-CM | POA: Diagnosis not present

## 2020-03-20 DIAGNOSIS — Z79899 Other long term (current) drug therapy: Secondary | ICD-10-CM | POA: Diagnosis not present

## 2020-03-20 DIAGNOSIS — R7982 Elevated C-reactive protein (CRP): Secondary | ICD-10-CM | POA: Insufficient documentation

## 2020-03-20 LAB — C-REACTIVE PROTEIN: CRP: 0.6 mg/dL (ref <1.0)

## 2020-03-20 LAB — BASIC METABOLIC PANEL
Anion gap: 10 (ref 5–15)
BUN: 8 mg/dL (ref 6–20)
CO2: 29 mmol/L (ref 22–32)
Calcium: 9.6 mg/dL (ref 8.9–10.3)
Chloride: 102 mmol/L (ref 98–111)
Creatinine, Ser: 0.93 mg/dL (ref 0.61–1.24)
GFR calc Af Amer: >60 mL/min (ref >60)
GFR calc non Af Amer: >60 mL/min (ref >60)
Glucose, Bld: 94 mg/dL (ref 70–99)
Potassium: 3.8 mmol/L (ref 3.5–5.1)
Sodium: 141 mmol/L (ref 135–145)

## 2020-03-20 NOTE — Progress Notes (Signed)
PCP: Pediatrics, [place]  Primary Cardiologist: Dr Aundra Dubin   HPI: Drewey is a 19 year old with a history of COVID in April of this year. No previous coronary disease.  Presented to [facility] with chest pain radiating down left arm on 03/09/2020. HS Trop [id number], HIV NR, SARs 2 negative, TSH 1.7 , and CRP 14.2 .Had cath with normal coronaries, EF 35%,and preserved cardiac output.Started on ibuprofen, colchicine, and protonix for myopericarditis. Also placed on low dose carvedilol.   Today he returns for HF follow up. Overall feeling better. Denies chest pain. Denies SOB/PND/Orthopnea. Appetite ok. No fever or chills. His Mom has been helping him with his medications. Says he hasnt had carvedilol for the last 3 days (SBP was 100 and heart rate 63).  His Mom wanted to make sure he should take the carvedilol.   [place] 03/09/2020 - normal coronaries. EF 30-35% on LV-gram   ECHO 03/10/2020  EF in the 45-50% range with normal RV.   CMRI 03/10/2020 1.  Normal LV size with lateral hypokinesis, EF 51%. 2.  Normal RV size and systolic function, EF 10%. 3.  Elevated T2 signal in the lateral wall. 4. Diffuse mid-wall and subepicardial LGE in the anterolateral and inferolateral walls. 5. Patchy pericardial enhancement with trivial inferior pericardial effusion. This study was consistent with acute myopericarditis.  ROS: All systems negative except as listed in HPI, PMH and Problem List.  SH:  Social History   Socioeconomic History  . Marital status: Single    Spouse name: Not on file  . Number of children: Not on file  . Years of education: Not on file  . Highest education level: Not on file  Occupational History  . Not on file  Tobacco Use  . Smoking status: Never Smoker  . Smokeless tobacco: Never Used  Substance and Sexual Activity  . Alcohol use: No  . Drug use: No  . Sexual activity: Never  Other Topics Concern  . Not on file  Social History Narrative   Althea Grimmer in  [employer]   freshman   Social Determinants of Health   Financial Resource Strain:   . Difficulty of Paying Living Expenses:   Food Insecurity:   . Worried About Charity fundraiser in the Last Year:   . Arboriculturist in the Last Year:   Transportation Needs:   . Film/video editor (Medical):   Marland Kitchen Lack of Transportation (Non-Medical):   Physical Activity:   . Days of Exercise per Week:   . Minutes of Exercise per Session:   Stress:   . Feeling of Stress :   Social Connections:   . Frequency of Communication with Friends and Family:   . Frequency of Social Gatherings with Friends and Family:   . Attends Religious Services:   . Active Member of Clubs or Organizations:   . Attends Archivist Meetings:   Marland Kitchen Marital Status:   Intimate Partner Violence:   . Fear of Current or Ex-Partner:   . Emotionally Abused:   Marland Kitchen Physically Abused:   . Sexually Abused:     FH:  Family History  Problem Relation Age of Onset  . Sleep apnea Father   . Cancer Maternal Grandmother        ovarian  . Diabetes Maternal Grandmother   . Cancer Maternal Grandfather        prostate  . Diabetes Maternal Grandfather   . Diabetes Paternal Grandmother   . Hyperlipidemia Paternal Grandmother   .  Hypertension Paternal Grandmother   . Hypertension Paternal Grandfather   . Hyperlipidemia Paternal Grandfather   . Heart disease Paternal Grandfather   . Hearing loss Neg Hx   . Early death Neg Hx   . Drug abuse Neg Hx   . Depression Neg Hx   . COPD Neg Hx   . Birth defects Neg Hx   . Asthma Neg Hx   . Arthritis Neg Hx   . Alcohol abuse Neg Hx   . Kidney disease Neg Hx   . Learning disabilities Neg Hx   . Mental retardation Neg Hx   . Miscarriages / Stillbirths Neg Hx   . Stroke Neg Hx   . Vision loss Neg Hx   . Varicose Veins Neg Hx     Past Medical History:  Diagnosis Date  . COVID-19 virus detected    a. positive for COVID -19 in 01/2020.    Current Outpatient Medications    Medication Sig Dispense Refill  . carvedilol (COREG) 3.125 MG tablet Take 1 tablet (3.125 mg total) by mouth 2 (two) times daily with a meal. 60 tablet 6  . colchicine 0.6 MG tablet Take 1 tablet (0.6 mg total) by mouth 2 (two) times daily. 60 tablet 2  . ibuprofen (ADVIL) 800 MG tablet Take 1 tablet (800 mg total) by mouth 3 (three) times daily. 36 tablet 0  . pantoprazole (PROTONIX) 40 MG tablet Take 1 tablet (40 mg total) by mouth daily. 12 tablet 0  . acetaminophen (TYLENOL) 500 MG tablet Take 1,000 mg by mouth every 6 (six) hours as needed for headache (pain). (Patient not taking: Reported on 03/20/2020)     No current facility-administered medications for this encounter.    Vitals:   03/20/20 1024  BP: 112/78  Pulse: (!) 59  SpO2: 99%  Weight: 69.9 kg  Height: 5\' 11"  (1.803 m)    PHYSICAL EXAM: General:  Well appearing. No resp difficulty HEENT: normal Neck: supple. JVP flat. Carotids 2+ bilaterally; no bruits. No lymphadenopathy or thryomegaly appreciated. Cor: PMI normal. Regular rate & rhythm. No rubs, gallops or murmurs. Lungs: clear Abdomen: soft, nontender, nondistended. No hepatosplenomegaly. No bruits or masses. Good bowel sounds. Extremities: no cyanosis, clubbing, rash, edema Neuro: alert & orientedx3, cranial nerves grossly intact. Moves all 4 extremities w/o difficulty. Affect pleasant.   ECG: SR 56 bpm     ASSESSMENT & PLAN: 1. Myopericarditis: 03/09/20 diagnosed. ECG with inferior and anterolateral STE. Cath with no significant coronary disease and filling pressures normal. CRP elevated. Significantly elevated HS-TnI.  ECHO 03/10/20 LV EF 45-50% with normal RV. TSH, HIV, and COVID-19 negative.  - Respiratory virus panel was negative.  - CRP on admit 6/6 14.2--->0.6 today  - Continue colchicine 0.6 bid x 3 months then he can stop.  - Ibuprofen 800 mg tid + Protonix 40 daily x 2 week course. He was instructed to stop ibuprofen when he completes the rest  of the protonix. - Continue Coreg 3.125 mg bid. Discussed indications and when to take versus hold.  - No room to add arb given he has not been taking coreg.    - cardiac MRI c/w myopericarditis.  - Avoid strenuous activity x 6 months. Would suggest keeping lifting <30 lbs at work.   2. COVID-19: Had in 4/21 and symptoms had resolved.  Check BMEt and CRP today. CRP trending down.  Follow up 12 weeks with Dr 5/21 and an ECHO.  Discussed all medications with him and his mom. He  needs to avoid strenuous activity limit lifting to < 30 pounds.  Greater than 50% of the (total minutes 30) visit spent in counseling/coordination of care regarding the above    Mindel Friscia NP-C  2:23 PM'

## 2020-03-20 NOTE — Patient Instructions (Addendum)
STOP Ibuprofen once you finish Protonix   Take Coreg 3.125 mg twice a day  everyday UNLESS your heart rate is less than 60 or SBP (top number) is less than 100  Your physician recommends that you schedule a follow-up appointment in: 6 weeks  in the [facility] Pract[facility] (PA/NP) [facility]   Your physician recommends that you schedule a follow-up appointment in: 12 weeks with Dr Shirlee Latch and echo  Your physician has requested that you have an echocardiogram. Echocardiography is a painless test that uses sound waves to create images of your heart. It provides your doctor with information about the size and shape of your heart and how well your heart's chambers and valves are working. This procedure takes approximately one hour. There are no restrictions for this procedure.  At the [facility], you and your health needs are our priority. As part of our continuing mission to provide you with exceptional heart care, we have created designated Provider Care Teams. These Care Teams include your primary Cardiologist (physician) and Advanced Practice Providers (APPs- Physician Assistants and Nurse Practitioners) who all work together to provide you with the care you need, when you need it.   You may see any of the following providers on your designated Care Team at your next follow up: Marland Kitchen Dr Arvilla Meres . Dr Marca Ancona . Tonye Becket, NP . Robbie Lis, PA . Karle Plumber, PharmD   Please be sure to bring in all your medications bottles to every appointment.

## 2020-05-01 ENCOUNTER — Encounter (HOSPITAL_COMMUNITY): Payer: Medicaid Other

## 2020-05-29 ENCOUNTER — Other Ambulatory Visit (HOSPITAL_COMMUNITY): Payer: Self-pay

## 2020-05-29 ENCOUNTER — Encounter (HOSPITAL_COMMUNITY): Payer: Medicaid Other | Admitting: Cardiology

## 2020-05-29 ENCOUNTER — Ambulatory Visit (HOSPITAL_COMMUNITY): Admission: RE | Admit: 2020-05-29 | Payer: Medicaid Other | Source: Ambulatory Visit

## 2020-05-29 DIAGNOSIS — B3322 Viral myocarditis: Secondary | ICD-10-CM

## 2020-05-30 ENCOUNTER — Ambulatory Visit (HOSPITAL_COMMUNITY)
Admission: RE | Admit: 2020-05-30 | Discharge: 2020-05-30 | Disposition: A | Discharge status: Home / Self Care | Payer: Medicaid Other | Source: Ambulatory Visit | Attending: Adult Health | Admitting: Adult Health

## 2020-05-30 ENCOUNTER — Encounter (HOSPITAL_COMMUNITY): Payer: Medicaid Other | Admitting: Cardiology

## 2020-05-30 DIAGNOSIS — B3322 Viral myocarditis: Secondary | ICD-10-CM

## 2020-06-17 ENCOUNTER — Encounter (HOSPITAL_COMMUNITY): Payer: Medicaid Other | Admitting: Cardiology

## 2020-06-17 ENCOUNTER — Other Ambulatory Visit (HOSPITAL_COMMUNITY): Payer: Medicaid Other

## 2020-08-12 ENCOUNTER — Other Ambulatory Visit: Payer: Self-pay

## 2020-08-12 ENCOUNTER — Ambulatory Visit (HOSPITAL_COMMUNITY)
Admission: RE | Admit: 2020-08-12 | Discharge: 2020-08-12 | Disposition: A | Discharge status: Home / Self Care | Payer: Medicaid Other | Source: Ambulatory Visit | Attending: Cardiology | Admitting: Cardiology

## 2020-08-12 ENCOUNTER — Ambulatory Visit (HOSPITAL_BASED_OUTPATIENT_CLINIC_OR_DEPARTMENT_OTHER)
Admission: RE | Admit: 2020-08-12 | Discharge: 2020-08-12 | Disposition: A | Discharge status: Home / Self Care | Payer: Medicaid Other | Source: Ambulatory Visit | Attending: Cardiology | Admitting: Cardiology

## 2020-08-12 ENCOUNTER — Encounter (HOSPITAL_COMMUNITY): Payer: Self-pay

## 2020-08-12 VITALS — BP 110/80 | HR 72 | Wt 156.4 lb

## 2020-08-12 DIAGNOSIS — I5022 Chronic systolic (congestive) heart failure: Secondary | ICD-10-CM

## 2020-08-12 DIAGNOSIS — B3322 Viral myocarditis: Secondary | ICD-10-CM | POA: Insufficient documentation

## 2020-08-12 DIAGNOSIS — Z8616 Personal history of COVID-19: Secondary | ICD-10-CM | POA: Insufficient documentation

## 2020-08-12 LAB — SEDIMENTATION RATE: Sed Rate: 2 mm/hr (ref 0–16)

## 2020-08-12 LAB — BASIC METABOLIC PANEL
Anion gap: 11 (ref 5–15)
BUN: 8 mg/dL (ref 6–20)
CO2: 28 mmol/L (ref 22–32)
Calcium: 10.1 mg/dL (ref 8.9–10.3)
Chloride: 103 mmol/L (ref 98–111)
Creatinine, Ser: 0.91 mg/dL (ref 0.61–1.24)
GFR, Estimated: >60 mL/min (ref >60)
Glucose, Bld: 97 mg/dL (ref 70–99)
Potassium: 3.9 mmol/L (ref 3.5–5.1)
Sodium: 142 mmol/L (ref 135–145)

## 2020-08-12 LAB — ECHOCARDIOGRAM COMPLETE
Area-P 1/2: 6.27 cm2
S' Lateral: 3.2 cm

## 2020-08-12 NOTE — Progress Notes (Signed)
  Echocardiogram 2D Echocardiogram with 3D has been performed.  Leta Jungling M 08/12/2020, 8:36 AM

## 2020-08-12 NOTE — Progress Notes (Signed)
PCP: Pediatrics, [place]  Primary Cardiologist: Dr Aundra Dubin   HPI: Jesus Holmes is a 19 year old with a history of COVID in April of this year. No previous coronary disease.  Presented to [facility] with chest pain radiating down left arm on 03/09/2020. HS Trop [id number], HIV NR, SARs 2 negative, TSH 1.7 , and CRP 14.2. Had cath with normal coronaries, EF 35%,and preserved cardiac output.Started on ibuprofen, colchicine, and protonix for myopericarditis. Also placed on low dose carvedilol. BP was too soft for ARB/ARNi.  He presents to clinic today for f/u. Reports doing well. Denies CP. No dyspnea. Echo was repeated today. Interpretation pending. He is tolerating Coreg ok. BP 110/80 before am meds. Denies dizziness. He is still taking colchicine. He has been restricting physical activity, avoiding anything strenuous.    Cardiac Studies  [facility] 03/09/2020 - normal coronaries. EF 30-35% on LV-gram   ECHO 03/10/2020  EF in the 45-50% range with normal RV.   CMRI 03/10/2020 1.  Normal LV size with lateral hypokinesis, EF 51%. 2.  Normal RV size and systolic function, EF 64%. 3.  Elevated T2 signal in the lateral wall. 4. Diffuse mid-wall and subepicardial LGE in the anterolateral and inferolateral walls. 5. Patchy pericardial enhancement with trivial inferior pericardial effusion. This study was consistent with acute myopericarditis.  ROS: All systems negative except as listed in HPI, PMH and Problem List.  SH:  Social History   Socioeconomic History  . Marital status: Single    Spouse name: Not on file  . Number of children: Not on file  . Years of education: Not on file  . Highest education level: Not on file  Occupational History  . Not on file  Tobacco Use  . Smoking status: Never Smoker  . Smokeless tobacco: Never Used  Substance and Sexual Activity  . Alcohol use: No  . Drug use: No  . Sexual activity: Never  Other Topics Concern  . Not on file  Social History Narrative    Althea Grimmer in [employer]   freshman   Social Determinants of Health   Financial Resource Strain:   . Difficulty of Paying Living Expenses: Not on file  Food Insecurity:   . Worried About Charity fundraiser in the Last Year: Not on file  . Ran Out of Food in the Last Year: Not on file  Transportation Needs:   . Lack of Transportation (Medical): Not on file  . Lack of Transportation (Non-Medical): Not on file  Physical Activity:   . Days of Exercise per Week: Not on file  . Minutes of Exercise per Session: Not on file  Stress:   . Feeling of Stress : Not on file  Social Connections:   . Frequency of Communication with Friends and Family: Not on file  . Frequency of Social Gatherings with Friends and Family: Not on file  . Attends Religious Services: Not on file  . Active Member of Clubs or Organizations: Not on file  . Attends Archivist Meetings: Not on file  . Marital Status: Not on file  Intimate Partner Violence:   . Fear of Current or Ex-Partner: Not on file  . Emotionally Abused: Not on file  . Physically Abused: Not on file  . Sexually Abused: Not on file    FH:  Family History  Problem Relation Age of Onset  . Sleep apnea Father   . Cancer Maternal Grandmother        ovarian  . Diabetes Maternal Grandmother   .  Cancer Maternal Grandfather        prostate  . Diabetes Maternal Grandfather   . Diabetes Paternal Grandmother   . Hyperlipidemia Paternal Grandmother   . Hypertension Paternal Grandmother   . Hypertension Paternal Grandfather   . Hyperlipidemia Paternal Grandfather   . Heart disease Paternal Grandfather   . Hearing loss Neg Hx   . Early death Neg Hx   . Drug abuse Neg Hx   . Depression Neg Hx   . COPD Neg Hx   . Birth defects Neg Hx   . Asthma Neg Hx   . Arthritis Neg Hx   . Alcohol abuse Neg Hx   . Kidney disease Neg Hx   . Learning disabilities Neg Hx   . Mental retardation Neg Hx   . Miscarriages / Stillbirths Neg Hx   .  Stroke Neg Hx   . Vision loss Neg Hx   . Varicose Veins Neg Hx     Past Medical History:  Diagnosis Date  . COVID-19 virus detected    a. positive for COVID -19 in 01/2020.    Current Outpatient Medications  Medication Sig Dispense Refill  . acetaminophen (TYLENOL) 500 MG tablet Take 1,000 mg by mouth every 6 (six) hours as needed for headache (pain).     . carvedilol (COREG) 3.125 MG tablet Take 1 tablet (3.125 mg total) by mouth 2 (two) times daily with a meal. 60 tablet 6  . colchicine 0.6 MG tablet Take 1 tablet (0.6 mg total) by mouth 2 (two) times daily. 60 tablet 2  . pantoprazole (PROTONIX) 40 MG tablet Take 1 tablet (40 mg total) by mouth daily. 12 tablet 0   No current facility-administered medications for this encounter.    Vitals:   08/12/20 0841  BP: 110/80  Pulse: 72  SpO2: 99%  Weight: 70.9 kg (156 lb 6.4 oz)    PHYSICAL EXAM: General:  Well appearing young male. No respiratory difficulty HEENT: normal Neck: supple. no JVD. Carotids 2+ bilat; no bruits. No lymphadenopathy or thyromegaly appreciated. Cor: PMI nondisplaced. Regular rate & rhythm. No rubs, gallops or murmurs. Lungs: clear Abdomen: soft, nontender, nondistended. No hepatosplenomegaly. No bruits or masses. Good bowel sounds. Extremities: no cyanosis, clubbing, rash, edema Neuro: alert & oriented x 3, cranial nerves grossly intact. moves all 4 extremities w/o difficulty. Affect pleasant   ASSESSMENT & PLAN: 1. Myopericarditis:  - Diagnosed 03/09/20. ECG with inferior and anterolateral STE. Cath with no significant coronary disease and filling pressures normal. CRP elevated. Significantly elevated HS-TnI. ECHO 03/10/20 LV EF 45-50% with normal RV. TSH, HIV, and COVID-19 negative. Cardiac MRI c/w myopericarditis. Respiratory virus panel was negative. CRP on admit 6/6 14.2.  - He has completed 2 week course of ibuprofen and 3 months of colchicine. Stable w/o symptoms - Repeat echo completed today.  Interpretation pending - Recheck CRP and ESR. If normal, can stop colchicine - Continue  blocker therapy w/ Coreg - If EF still reduced, may try adding low dose ARB. Will check BMP for baseline assessment of SCr/K   - Continue w/ activity restriction at least for the next month (avoid strenuous activity for total of 6 months).    2. COVID-19: Had in 4/21 and symptoms had resolved.   F/u w/ Dr. Aundra Dubin in 3 months      Lyda Jester PA-C  8:44 AM'

## 2020-08-12 NOTE — Patient Instructions (Signed)
Labs done today. We will contact you only if your labs are abnormal.  No medication changes were made. Please continue all current medications as prescribed.  Your physician recommends that you schedule a follow-up appointment in: 3 months with Dr. Shirlee Latch  If you have any questions or concerns before your next appointment please send [place] a message through [facility] or call our office at [phone].    TO LEAVE A MESSAGE FOR THE NURSE SELECT OPTION 2, PLEASE LEAVE A MESSAGE INCLUDING: . YOUR NAME . DATE OF BIRTH . CALL BACK NUMBER . REASON FOR CALL**this is important as we prioritize the call backs  YOU WILL RECEIVE A CALL BACK THE SAME DAY AS LONG AS YOU CALL BEFORE 4:00 PM   At the [facility], you and your health needs are our priority. As part of our continuing mission to provide you with exceptional heart care, we have created designated Provider Care Teams. These Care Teams include your primary Cardiologist (physician) and Advanced Practice Providers (APPs- Physician Assistants and Nurse Practitioners) who all work together to provide you with the care you need, when you need it.   You may see any of the following providers on your designated Care Team at your next follow up: Marland Kitchen Dr Arvilla Meres . Dr Marca Ancona . Tonye Becket, NP . Robbie Lis, PA . Karle Plumber, PharmD   Please be sure to bring in all your medications bottles to every appointment.

## 2020-08-13 ENCOUNTER — Telehealth (HOSPITAL_COMMUNITY): Payer: Self-pay

## 2020-08-13 NOTE — Telephone Encounter (Signed)
-----   Message from Allayne Butcher, [place] sent at 08/12/2020  2:53 PM EST ----- Inflammatory marker normal. Renal function and electrolytes normal. Ok to stop colchicine.

## 2020-08-13 NOTE — Telephone Encounter (Signed)
Patient advised and verbalized understanding. Med list updated to reflect changes.  ? ?

## 2020-11-14 ENCOUNTER — Encounter (HOSPITAL_COMMUNITY): Payer: Medicaid Other | Admitting: Cardiology

## 2021-02-16 IMAGING — DX DG CHEST 1V PORT
1 series · 1 of 1 positions shown · non-contrast
Comparison: Portable exam 8282 hours without priors for comparison

CLINICAL DATA: Chest pain and tightness since [REDACTED], fever at
home, code STEMI

EXAM:
PORTABLE CHEST 1 VIEW

[chest]
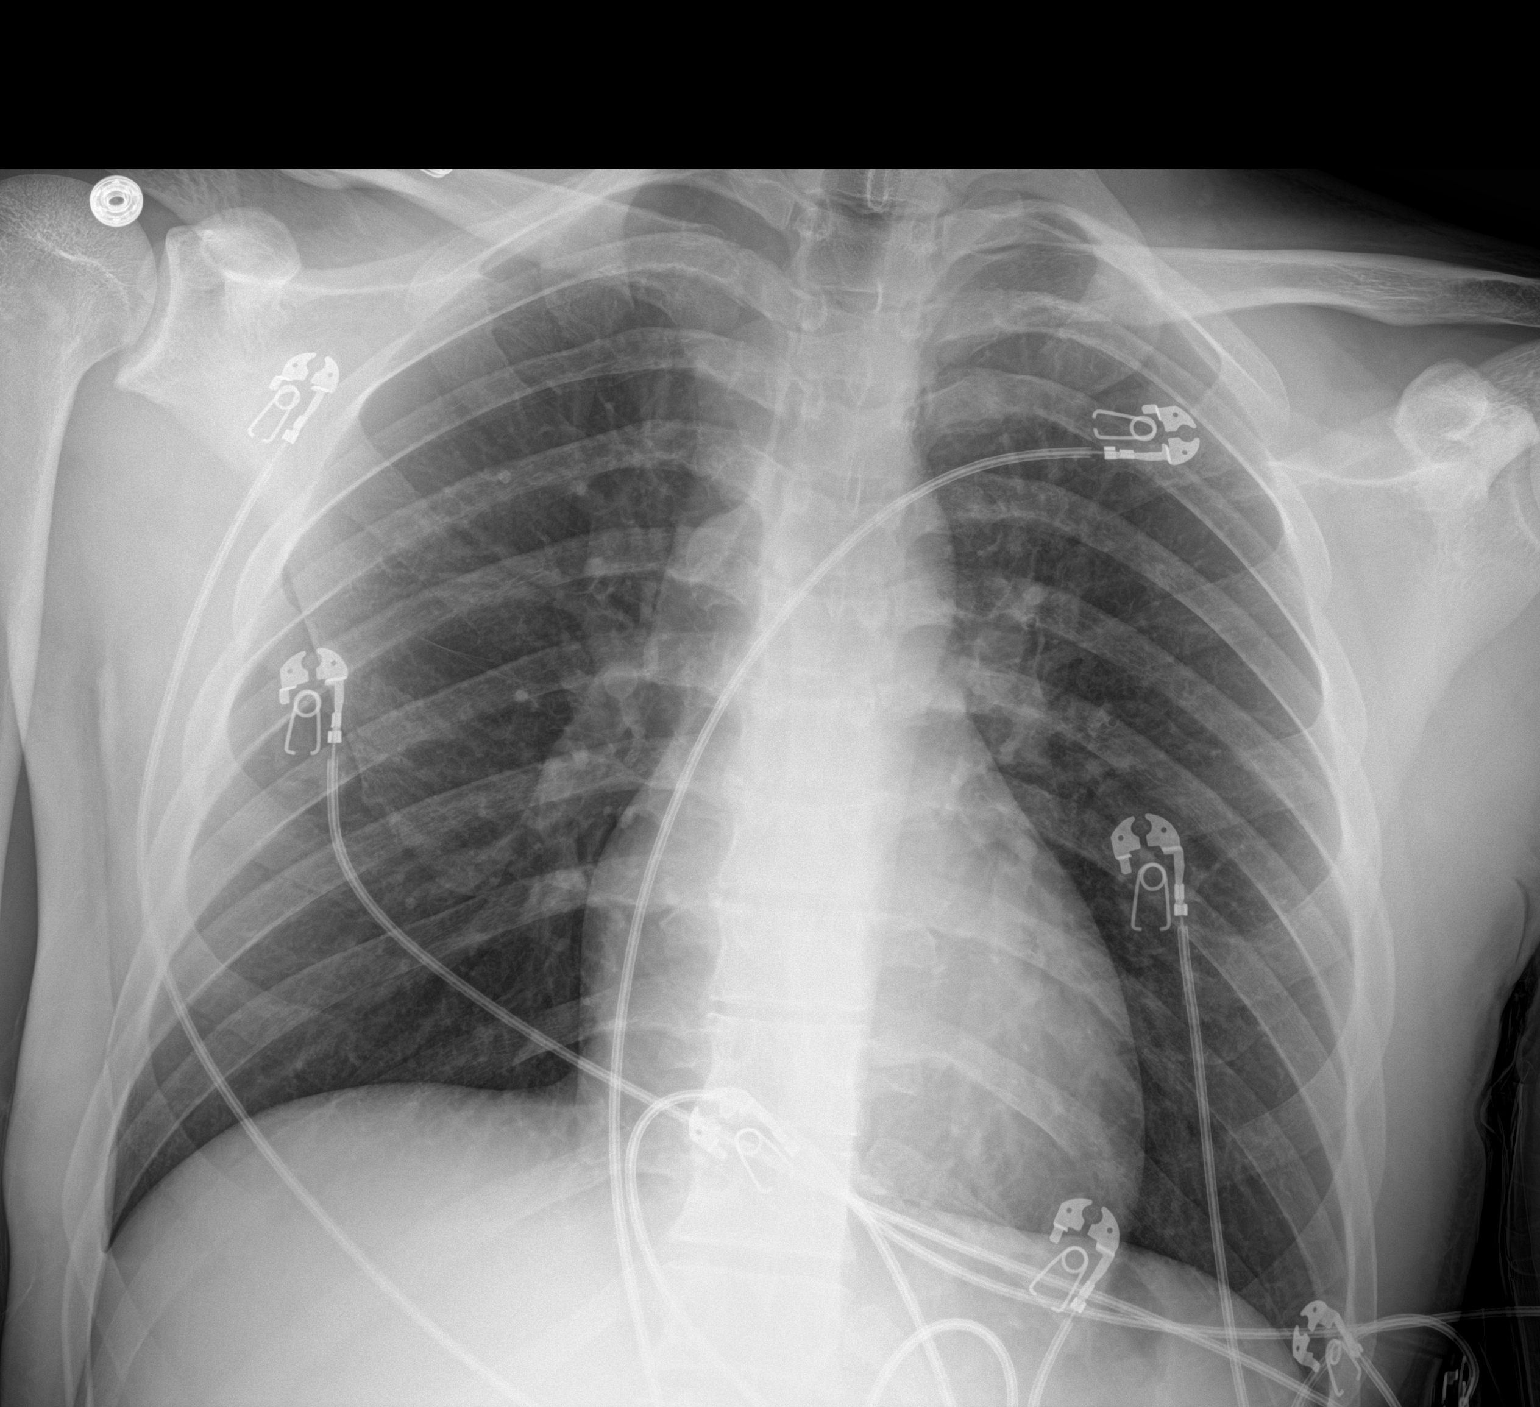

[1 of 1 positions shown; findings below may reference images not displayed]

FINDINGS: Normal heart size, mediastinal contours, and pulmonary vascularity.

Lungs clear.

No pleural effusion or pneumothorax.

Bones unremarkable.
IMPRESSION: No acute abnormalities.

## 2021-02-17 IMAGING — MR MR CARD MORPHOLOGY WO/W CM
44 of 48 series · 44 of 48 positions shown · IV contrast (gadavist)
Comparison: none

CLINICAL DATA: Myopericarditis

EXAM:
CARDIAC MRI
TECHNIQUE: The patient was scanned on a 1.5 Tesla GE magnet. A dedicated
cardiac coil was used. Functional imaging was done using Fiesta
sequences. [DATE], and 4 chamber views were done to assess for RWMA's.
Modified Rambriksh rule using a short axis stack was used to
calculate an ejection fraction on a dedicated work station using
Circle software. The patient received 8 cc of Gadavist. After 10
minutes inversion recovery sequences were used to assess for
infiltration and scar tissue.
CONTRAST:  Gadavist 8 cc

[Series 4: [id number]_bh · axial · 8.0mm · 1.33mm/px · 1 of 16 slices shown]
[im 1/16]
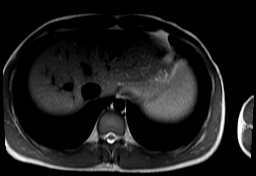

[Series 8: bSSFP · oblique · 8.0mm · 1.61mm/px · 1 of 25 slices shown (1 of 27)]
[im 1/25]
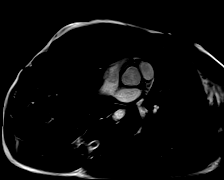

[Series 9: bSSFP · oblique · 8.0mm · 1.61mm/px · 1 of 25 slices shown (2 of 27)]
[im 1/25]
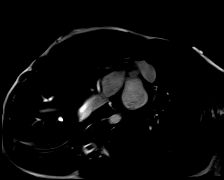

[Series 10: bSSFP · oblique · 8.0mm · 1.61mm/px · 1 of 25 slices shown (3 of 27)]
[im 1/25]
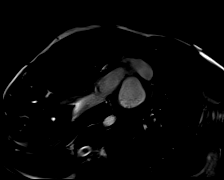

[Series 11: bSSFP · oblique · 8.0mm · 1.61mm/px · 1 of 25 slices shown (4 of 27)]
[im 1/25]
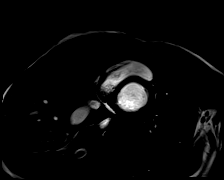

[Series 12: bSSFP · oblique · 8.0mm · 1.61mm/px · 1 of 25 slices shown (5 of 27)]
[im 1/25]
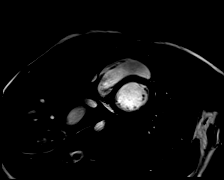

[Series 13: bSSFP · oblique · 8.0mm · 1.61mm/px · 1 of 25 slices shown (6 of 27)]
[im 1/25]
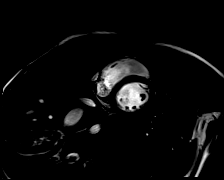

[Series 14: bSSFP · oblique · 8.0mm · 1.61mm/px · 1 of 25 slices shown (7 of 27)]
[im 1/25]
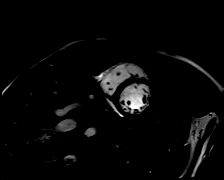

[Series 15: bSSFP · oblique · 8.0mm · 1.61mm/px · 1 of 25 slices shown (8 of 27)]
[im 1/25]
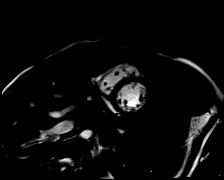

[Series 16: bSSFP · oblique · 8.0mm · 1.61mm/px · 1 of 25 slices shown (9 of 27)]
[im 1/25]
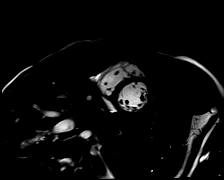

[Series 17: bSSFP · oblique · 8.0mm · 1.61mm/px · 1 of 25 slices shown (10 of 27)]
[im 1/25]
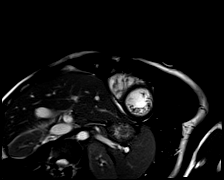

[Series 18: bSSFP · oblique · 8.0mm · 1.61mm/px · 1 of 25 slices shown (11 of 27)]
[im 1/25]
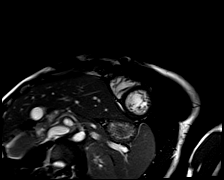

[Series 19: bSSFP · oblique · 8.0mm · 1.61mm/px · 1 of 25 slices shown (12 of 27)]
[im 1/25]
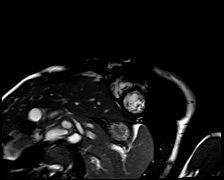

[Series 20: bSSFP · oblique · 8.0mm · 1.61mm/px · 1 of 25 slices shown (13 of 27)]
[im 1/25]
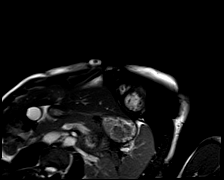

[Series 21: bSSFP · oblique · 8.0mm · 1.61mm/px · 1 of 25 slices shown (14 of 27)]
[im 1/25]
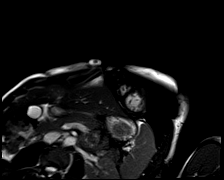

[Series 22: bSSFP · oblique · 8.0mm · 1.61mm/px · 1 of 25 slices shown (15 of 27)]
[im 1/25]
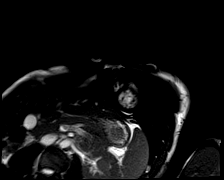

[Series 23: bSSFP · oblique · 8.0mm · 1.61mm/px · 1 of 25 slices shown (16 of 27)]
[im 1/25]
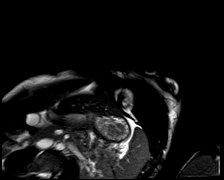

[Series 24: bSSFP · oblique · 8.0mm · 1.61mm/px · 1 of 25 slices shown (17 of 27)]
[im 1/25]
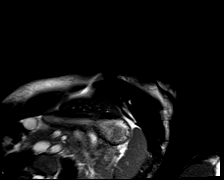

[Series 25: bSSFP · oblique · 8.0mm · 1.61mm/px · 1 of 25 slices shown (18 of 27)]
[im 1/25]
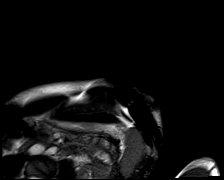

[Series 26: bSSFP · oblique · 8.0mm · 1.61mm/px · 1 of 25 slices shown (19 of 27)]
[im 1/25]
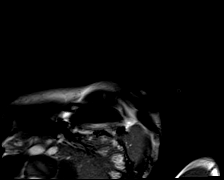

[Series 27: (id)_long_t1 · oblique · 8.0mm · 1.41mm/px · 1 of 24 slices shown]
[im 1/24]
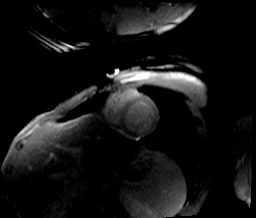

[Series 28: (id)_long_t1_moco · oblique · 8.0mm · 1.41mm/px · 1 of 24 slices shown]
[im 1/24]
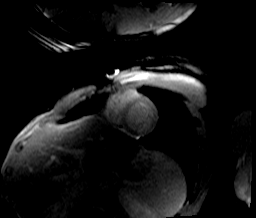

[Series 29: (id)_long_t1_moco_t1 · oblique · 8.0mm · 1.41mm/px · 1 of 6 slices shown]
[im 1/6]
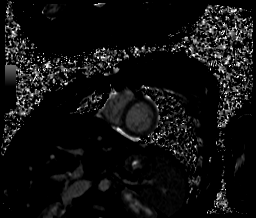

[Series 31: (id)_trufi · oblique · 8.0mm · 1.88mm/px · 1 of 9 slices shown]
[im 1/9]
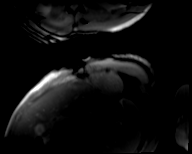

[Series 32: (id)_trufi_moco · oblique · 8.0mm · 1.88mm/px · 1 of 9 slices shown]
[im 1/9]
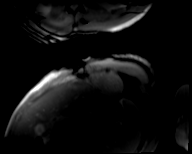

[Series 33: (id)_trufi_moco_t2 · oblique · 8.0mm · 1.88mm/px · 1 of 4 slices shown]
[im 1/4]
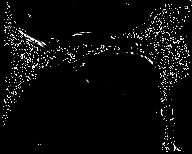

[Series 36: bSSFP · oblique · 6.0mm · 1.41mm/px · 1 of 25 slices shown (20 of 27)]
[im 1/25]
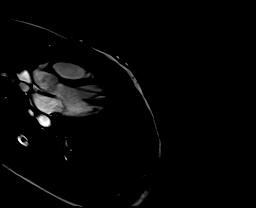

[Series 37: bSSFP · oblique · 6.0mm · 1.41mm/px · 1 of 25 slices shown (21 of 27)]
[im 1/25]
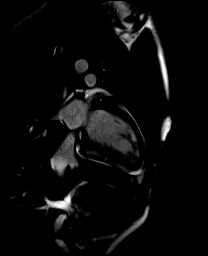

[Series 38: bSSFP · oblique · 6.0mm · 1.41mm/px · 1 of 25 slices shown (22 of 27)]
[im 1/25]
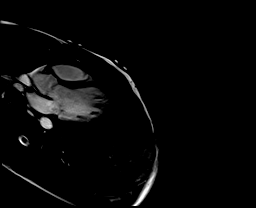

[Series 39: bSSFP · oblique · 6.0mm · 1.41mm/px · 1 of 25 slices shown (23 of 27)]
[im 1/25]
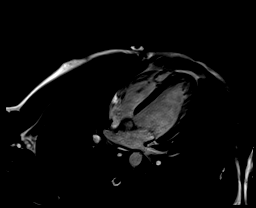

[Series 40: STIR · oblique · 8.0mm · 1.73mm/px · 1 of 17 slices shown]
[im 1/17]
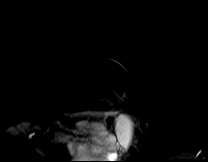

[Series 41: bSSFP · coronal · 6.0mm · 1.41mm/px · 1 of 25 slices shown (24 of 27)]
[im 1/25]
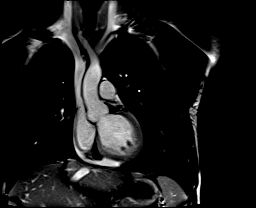

[Series 42: aortic valve cine · axial · 6.0mm · 1.41mm/px · 1 of 25 slices shown]
[im 1/25]
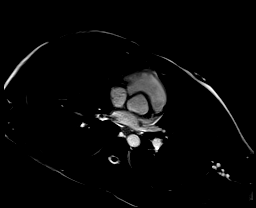

[Series 43: cine rvit · oblique · 6.0mm · 1.41mm/px · 1 of 25 slices shown (1 of 2)]
[im 1/25]
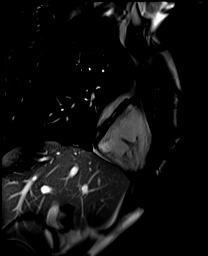

[Series 44: cine rvot · sagittal · 6.0mm · 1.41mm/px · 1 of 25 slices shown]
[im 1/25]
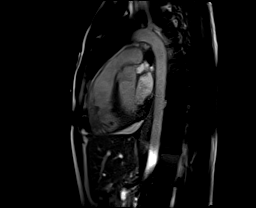

[Series 45: cine rvit · oblique · 6.0mm · 1.41mm/px · 1 of 25 slices shown (2 of 2)]
[im 1/25]
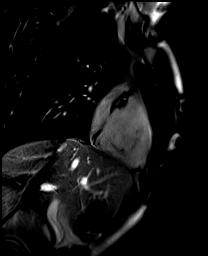

[Series 47: lge_single shot sa · oblique · 8.0mm · 1.98mm/px · 1 of 10 slices shown (1 of 2)]
[im 1/10]
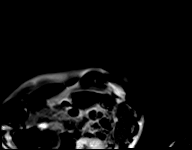

[Series 48: lge_single shot sa · oblique · 8.0mm · 1.98mm/px · 1 of 10 slices shown (2 of 2)]
[im 1/10]
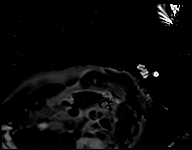

[Series 55: (id)_short_t1 · oblique · 8.0mm · 1.41mm/px · 1 of 27 slices shown]
[im 1/27]
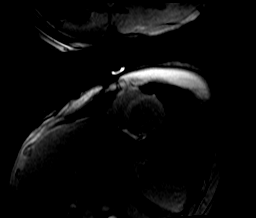

[Series 56: (id)_short_t1_moco · oblique · 8.0mm · 1.41mm/px · 1 of 27 slices shown]
[im 1/27]
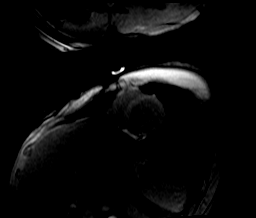

[Series 57: (id)_short_t1_moco_t1 · oblique · 8.0mm · 1.41mm/px · 1 of 6 slices shown]
[im 1/6]
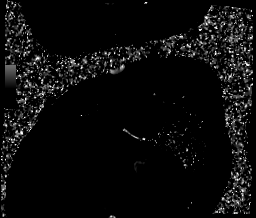

[Series 60: bSSFP · oblique · 6.0mm · 1.73mm/px · 1 of 15 slices shown (25 of 27)]
[im 1/15]
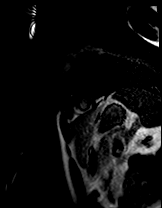

[Series 61: bSSFP · oblique · 6.0mm · 1.73mm/px · 1 of 15 slices shown (26 of 27)]
[im 1/15]
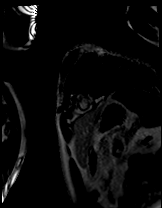

[Series 64: bSSFP · oblique · 6.0mm · 1.73mm/px · 1 of 6 slices shown (27 of 27)]
[im 1/6]
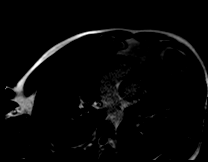

[44 of 48 positions shown; findings below may reference images not displayed]

FINDINGS: Limited images of the lung fields showed no gross abnormalities.

Trivial pericardial effusion inferiorly.

Normal left ventricular size and wall thickness. There was lateral
wall hypokinesis, LV EF 51%. Normal right ventricular size and
systolic function, EF 45%. Trileaflet aortic valve with no
significant regurgitation or stenosis. No significant mitral
regurgitation.

Delayed enhancement images showed diffuse mid-wall and subepicardial
late gadolinium enhancement (LGE) throughout the anterolateral and
inferolateral walls. There also appeared to be patchy pericardial
enhancement.

Measurements:

LVEDV 157

LVSV 80

LVEF 51%

RVEDV 134 mL

RVSV 60 mL
RVEF 45%

T2 57 in lateral wall
IMPRESSION: 1.  Normal LV size with lateral hypokinesis, EF 51%.

2.  Normal RV size and systolic function, EF 45%.

3.  Elevated T2 signal in the lateral wall.

4. Diffuse mid-wall and subepicardial LGE in the anterolateral and
inferolateral walls.

5. Patchy pericardial enhancement with trivial inferior pericardial
effusion.

This study is consistent with acute myopericarditis.

Charito Castleman

## 2021-08-07 ENCOUNTER — Other Ambulatory Visit: Payer: Self-pay

## 2021-08-07 ENCOUNTER — Ambulatory Visit (INDEPENDENT_AMBULATORY_CARE_PROVIDER_SITE_OTHER): Payer: Medicaid Other | Admitting: Pediatrics

## 2021-08-07 VITALS — Wt 158.0 lb

## 2021-08-07 DIAGNOSIS — R059 Cough, unspecified: Secondary | ICD-10-CM

## 2021-08-07 DIAGNOSIS — R062 Wheezing: Secondary | ICD-10-CM | POA: Diagnosis not present

## 2021-08-07 DIAGNOSIS — J9801 Acute bronchospasm: Secondary | ICD-10-CM | POA: Diagnosis not present

## 2021-08-07 DIAGNOSIS — J988 Other specified respiratory disorders: Secondary | ICD-10-CM

## 2021-08-07 MED ORDER — PREDNISONE 50 MG PO TABS: 25.0000 mg | ORAL_TABLET | Freq: Two times a day (BID) | ORAL | 0 refills | Status: AC

## 2021-08-07 MED ORDER — ALBUTEROL SULFATE HFA 108 (90 BASE) MCG/ACT IN AERS: 2.0000 | INHALATION_SPRAY | Freq: Four times a day (QID) | RESPIRATORY_TRACT | 1 refills | Status: AC | PRN

## 2021-08-07 MED ORDER — ALBUTEROL SULFATE (2.5 MG/3ML) 0.083% IN NEBU
2.5000 mg | INHALATION_SOLUTION | Freq: Once | RESPIRATORY_TRACT | Status: AC
  Administered 2021-08-07: 2.5 mg via RESPIRATORY_TRACT

## 2021-08-07 NOTE — Patient Instructions (Signed)
Bronchospasm, Adult Bronchospasm is when the small airways in the lungs narrow. This can make it very hard to breathe. Swelling and more mucus than normal can add to this problem. What are the causes? Having a cold. Exercise. The smell from sprays, perfumes, candles, and cleaners. Cold air. Stress or strong feelings such as laughing or crying. What increases the risk? Having asthma. Smoking. Being around someone who smokes (secondhand smoke). Having allergies. Being allergic to certain foods, medicine, or bug bites or stings. What are the signs or symptoms? Making a high-pitched whistling sound when you breathe, most often when you breathe out (wheezing). Coughing. A tight feeling in your chest. Feeling like you cannot catch your breath. Feeling like you have no energy to exercise. Breathing that is noisy. A cough that has a high pitch. How is this treated?  Using medicines that you breathe in (inhale). These open up the airways and help you breathe. Medicines can be taken with a metered dose inhaler or a nebulizer device. Taking medicines to reduce swelling. Getting rid of what started the bronchospasm. Follow these instructions at home: Medicines Take over-the-counter and prescription medicines only as told by your doctor. If you need to use an inhaler or nebulizer to take your medicine, ask your doctor how to use it. You may be given a spacer to use with your inhaler. This makes it easier to get the medicine from the inhaler into your lungs. Lifestyle Do not smoke or use any products that contain nicotine or tobacco. If you need help quitting, ask your doctor. Keep track of things that start your bronchospasm. Avoid these if you can. When pollen, air pollution, or humidity is bad, keep windows closed. Use an air conditioner if you have one. Find ways to cope with stress and your feelings. Activity Some people have bronchospasm when they exercise. This is called  exercise-induced bronchoconstriction (EIB). If you have this problem, talk with your doctor about how to deal with EIB. Some tips include: Use your inhaler before exercise. Exercise indoors if it is very cold or humid, or if the pollen and mold counts are high. Warm up and cool down before and after exercise. Stop your exercise right away if your symptoms start or get worse. General instructions If you have asthma, make sure you have an asthma action plan. Stay up to date on your shots (immunizations). Keep all follow-up visits. Get help right away if: You have trouble breathing. You wheeze and cough and this does not get better after you take medicine. You have chest pain. You have trouble speaking more than one word in a sentence. These symptoms may be an emergency. Get help right away. Call 911. Do not wait to see if the symptoms will go away. Do not drive yourself to the hospital. Summary Bronchospasm is when the small airways in the lungs narrow. Swelling and more mucus than normal can add to this problem. This can make it very hard to breathe. Do not smoke or use any products that contain nicotine or tobacco. If you need help quitting, ask your doctor. Get help right away if you wheeze and cough and this does not get better after you take medicine. This information is not intended to replace advice given to you by your health care provider. Make sure you discuss any questions you have with your health care provider. Document Revised: 04/13/2021 Document Reviewed: 04/13/2021 Elsevier Patient Education  2022 Elsevier Inc.  

## 2021-08-07 NOTE — Progress Notes (Signed)
Subjective:    Jesus Holmes is a 20 y.o. old male here with his mother for cough and Nasal Congestion   HPI: Jesus Holmes presents with history of 6 days ago with HA, runny nose, congestion, wet cough.  Yesterday at work diff breathing through 1 nostril.  No fever and not having HA anymore.  Not cough is much better now than a few days ago but is more in mornings or night.  Maybe a little tightness when taking deep breath.  Denies any body aches, chills, abd pain, ear pain, v/d, wheezing, chest pain, lethargy.     The following portions of the patient's history were reviewed and updated as appropriate: allergies, current medications, past family history, past medical history, past social history, past surgical history and problem list.  Review of Systems Pertinent items are noted in HPI.   Allergies: No Known Allergies +  Current Outpatient Medications on File Prior to Visit  Medication Sig Dispense Refill   acetaminophen (TYLENOL) 500 MG tablet Take 1,000 mg by mouth every 6 (six) hours as needed for headache (pain).      carvedilol (COREG) 3.125 MG tablet Take 1 tablet (3.125 mg total) by mouth 2 (two) times daily with a meal. 60 tablet 6   pantoprazole (PROTONIX) 40 MG tablet Take 1 tablet (40 mg total) by mouth daily. 12 tablet 0   No current facility-administered medications on file prior to visit.    History and Problem List: Past Medical History:  Diagnosis Date   COVID-19 virus detected    a. positive for COVID -19 in 01/2020.        Objective:    Wt 158 lb (71.7 kg)   BMI 22.04 kg/m   General: alert, active, non toxic, age appropriate interaction ENT: oropharynx moist, no lesions, uvula midline, nares no discharge, slight nasal congestion Eye:  PERRL, EOMI, conjunctivae clear, no discharge Ears: TM clear/intact bilateral, no discharge Neck: supple, no sig LAD Lungs: decreased bs bilateral with slight insp and exp wheeze:  post Albuterol neb:  increase bs bilateral and  increase wheezing Heart: RRR, Nl S1, S2, no murmurs Abd: soft, non tender, non distended, normal BS, no organomegaly, no masses appreciated Skin: no rashes Neuro: normal mental status, No focal deficits  No results found for this or any previous visit (from the past 72 hour(s)).     Assessment:   Jesus Holmes is a 20 y.o. old male with  1. Wheezing-associated respiratory infection (WARI)   2. Bronchospasm, acute     Plan:   --viral illness causing bronchoconstriction.  Improved after albuterol in office but still tight.  Start oral steroid to give 5 days and albuterol with spacer tid and prn nightly for next 2 days then prn q4-6hrs for cough/wheeze.  Discussed if no improvement or onset fever in 2-3 days to return or be seen.  Discussed what concerns to have evaluated right away.      Meds ordered this encounter  Medications   albuterol (PROVENTIL) (2.5 MG/3ML) 0.083% nebulizer solution 2.5 mg   albuterol (PROAIR HFA) 108 (90 Base) MCG/ACT inhaler    Sig: Inhale 2 puffs into the lungs every 6 (six) hours as needed for wheezing or shortness of breath. Use with spacer.    Dispense:  6.7 g    Refill:  1   predniSONE (DELTASONE) 50 MG tablet    Sig: Take 0.5 tablets (25 mg total) by mouth 2 (two) times daily with a meal for 5 days.    Dispense:  5 tablet    Refill:  0    No orders of the defined types were placed in this encounter.    Return if symptoms worsen or fail to improve. in 2-3 days or prior for concerns  Myles Gip, DO

## 2021-09-11 ENCOUNTER — Ambulatory Visit (INDEPENDENT_AMBULATORY_CARE_PROVIDER_SITE_OTHER): Payer: Medicaid Other | Admitting: Pediatrics

## 2021-09-11 ENCOUNTER — Encounter: Payer: Self-pay | Admitting: Pediatrics

## 2021-09-11 ENCOUNTER — Other Ambulatory Visit: Payer: Self-pay

## 2021-09-11 VITALS — BP 122/68 | Ht 69.5 in | Wt 158.2 lb

## 2021-09-11 DIAGNOSIS — Z0000 Encounter for general adult medical examination without abnormal findings: Secondary | ICD-10-CM

## 2021-09-11 DIAGNOSIS — Z7251 High risk heterosexual behavior: Secondary | ICD-10-CM

## 2021-09-11 DIAGNOSIS — Z23 Encounter for immunization: Secondary | ICD-10-CM

## 2021-09-11 DIAGNOSIS — Z6823 Body mass index (BMI) 23.0-23.9, adult: Secondary | ICD-10-CM | POA: Diagnosis not present

## 2021-09-11 NOTE — Progress Notes (Signed)
Adolescent Well Care Visit Jesus Holmes is a 20 y.o. male who is here for well care.    PCP:  Myles Gip, DO   History was provided by the patient.  Confidentiality was discussed with the patient and, if applicable, with caregiver as well. [phone]  Current Issues: Current concerns include:  doing well working.  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  Was hospitalized for it last for a few days.   Nutrition: Nutrition/Eating Behaviors: good eater, 3 meals/day plus snacks, all food groups, mainly drinks water, gatorade Adequate calcium in diet?: adequate Supplements/ Vitamins: none  Exercise/ Media: Play any Sports?/ Exercise: trying to stay active and work  Screen Time:  > 2 hours-counseling provided Media Rules or Monitoring?: yes  Sleep:  Sleep: 10hrs  Social Screening: Lives with:  girlfriend Parental relations:  good Activities, Work, and Regulatory affairs officer?: Job Concerns regarding behavior with peers?  no Stressors of note: no  Education: Graduated HS, currently working  Menstruation:   No LMP for male patient. Menstrual History: NA   Confidential Social History: Tobacco?  occasional Secondhand smoke exposure?  no Drugs/ETOH?  no  Sexually Active?  Yes, having some irritation in urethra like itching feeling.  No discharge, dysuria.  Currently sexually active with girlfriend.  Multiple sex partners in the past but not currently.  Has never been tested for STI.  Pregnancy Prevention: discussed, recommend always using condoms.   Safe at home, in school & in relationships?  Yes Safe to self?  Yes   Screenings: Patient has a dental home: no - doesn't   Following topics were discussed as part of anticipatory guidance healthy eating, exercise, tobacco use, condom use, birth control, and suicidality/self harm.  PHQ-9 completed and results indicated, no concerns. Score: 0   Physical Exam:  Vitals:   09/11/21 0912  BP: 122/68  Weight: 158 lb 3.2 oz (71.8  kg)  Height: 5' 9.5" (1.765 m)   BP 122/68 (BP Location: Right Arm, Patient Position: Sitting)   Ht 5' 9.5" (1.765 m)   Wt 158 lb 3.2 oz (71.8 kg)   BMI 23.03 kg/m  Body mass index: body mass index is 23.03 kg/m. Growth percentile SmartLinks can only be used for patients less than 79 years old.  Hearing Screening   500Hz  1000Hz  2000Hz  3000Hz  4000Hz  5000Hz   Right ear 20 20 20 20 20 20   Left ear 20 20 20 20 20 20    Vision Screening   Right eye Left eye Both eyes  Without correction 10/10 10/10   With correction       General Appearance:   alert, oriented, no acute distress and well nourished  HENT: Normocephalic, no obvious abnormality, conjunctiva clear  Mouth:   Normal appearing teeth, no obvious discoloration, dental caries, or dental caps  Neck:   Supple; thyroid: no enlargement, symmetric, no tenderness/mass/nodules  Chest Normal male  Lungs:   Clear to auscultation bilaterally, normal work of breathing  Heart:   Regular rate and rhythm, S1 and S2 normal, no murmurs;   Abdomen:   Soft, non-tender, no mass, or organomegaly  GU normal male genitals, no penile discharge, no testicular masses or hernia or pain with palpation, Tanner stage 5  Musculoskeletal:   Tone and strength strong and symmetrical, all extremities      no scoliosis         Lymphatic:   No cervical adenopathy  Skin/Hair/Nails:   Skin warm, dry and intact, no rashes, no bruises or petechiae  Neurologic:   Strength, gait, and coordination normal and age-appropriate     Assessment and Plan:   1. Encounter for wellness examination in adult   2. Body mass index (BMI) 23.0-23.9, adult   3. High risk heterosexual behavior    --will check STI labs below for high risk sexual actiivity.  Will contact if results positive.  --discussed start transitioning to adult care.   BMI is appropriate for age  Hearing screening result:normal Vision screening result: normal  Counseling provided for all of the vaccine  components  Orders Placed This Encounter  Procedures   C. trachomatis/N. gonorrhoeae RNA   Meningococcal B, OMV (Bexsero)   HIV antibody (with reflex)   RPR  -- Declined flu vaccine after risks and benefits explained.   --Indications, contraindications and side effects of vaccine/vaccines discussed with parent and parent verbally expressed understanding and also agreed with the administration of vaccine/vaccines as ordered above  today.    Return in about 1 year (around 09/11/2022).Marland Kitchen  Myles Gip, DO

## 2021-09-11 NOTE — Patient Instructions (Signed)
Well Child Nutrition, Young Adult ?This sheet provides general nutrition recommendations. Talk with a health care provider or a diet and nutrition specialist (dietitian) if you have any questions. ?Nutrition ?The amount of food you need to eat every day depends on your age, sex, size, and activity level. To figure out your daily calorie needs, look for a calorie calculator online or talk with your health care provider. ?Balanced diet ?Eat a balanced diet. Try to include: ?Fruits. Aim for 2 cups a day. Examples of 1 cup of fruit include 1 large banana, 1 small apple, 8 large strawberries, or 1 large orange. Eat a variety of whole fruits and 100% fruit juice. Choose fresh, canned, frozen, or dried forms. Choose canned fruit that has the lowest added sugar or no added sugar. ?Vegetables. Aim for 2?-3 cups a day. Examples of 1 cup of vegetables include 2 medium carrots, 1 large tomato, or 2 stalks of celery. Choose fresh, frozen, canned, and dried options. Eat vegetables of a variety of colors. ?Low-fat dairy. Aim for 3 cups a day. Examples of 1 cup of dairy include 8 oz (230 mL) of milk, 8 oz (230 g) of yogurt, or 1? oz (44 g) of natural cheese. Choose fat-free or low-fat dairy products, including milk, yogurt, and cheese. If you are unable to tolerate dairy (lactose intolerant) or you choose not to consume dairy, you may include fortified soy beverages (soy milk). ?Whole grains. Of the grain foods that you eat each day (such as pasta, rice, and tortillas), aim to include 6-8 "ounce-equivalents" of whole-grain options. Examples of 1 ounce-equivalent of whole grains include 1 cup of whole-wheat cereal, ? cup of brown rice, or 1 slice of whole-wheat bread. Try to choose whole grains including brown rice, wild rice, quinoa, and oats. ?Lean proteins. Aim for 5?-6? "ounce-equivalents" a day. Eat a variety of protein foods, including lean meats, seafood, poultry, eggs, legumes (beans and peas), nuts, seeds, and soy  products. ?A cut of meat or fish that is the size of a deck of cards is about 3-4 ounce-equivalents. ?Foods that provide 1 ounce-equivalent of protein include 1 egg, ? cup of nuts or seeds, or 1 tablespoon (16 g) of peanut butter. ?For more information and options for foods in a balanced diet, visit www.choosemyplate.gov ?Tips for healthy snacking ?A snack should not be the size of a full meal. Eat snacks that have 200 calories or less. Examples include: ?? whole-wheat pita with ? cup hummus. ?2 or 3 slices of deli turkey wrapped around a cheese stick. ?? apple with 1 tablespoon of peanut butter. ?10 baked chips with salsa. ?Keep cut-up fruits and vegetables available at home and at school so they are easy to eat. ?Pack healthy snacks the night before or when you pack your lunch. ?Avoid pre-packaged foods. These tend to be higher in fat, sugar, and salt (sodium). ?Get involved with shopping, or ask the primary food shopper in your household to get healthy snacks that you like. ?Avoid chips, candy, cake, and soft drinks. ?Foods to avoid ?Fried or heavily processed foods, such as toaster pastries and microwaveable dinners. ?Drinks that contain a lot of sugar, such as sports drinks, sodas, and juice. ?Foods that contain a lot of fat, sodium, or sugar. ?Food safety ?Prepare your food safely: ?Wash your hands after handling raw meats. ?Keep food preparation surfaces clean by washing them regularly with hot, soapy water. ?Keep raw meats separate from foods that are ready-to-eat, such as fruits and vegetables. ?Cook seafood,   meat, poultry, and eggs to the recommended minimum safe internal temperature. ?Store foods at safe temperatures. In general: ?Keep cold foods at 40?F (4?C) or colder. ?Keep your freezer at 0?F (-18?C or 18 degrees below 0?C) or colder. ?Keep hot foods at 140?F (60?C) or warmer. ?Foods are no longer safe to eat when they have been at a temperature of 40-140?F (4-60?C) for more than 2 hours. ?Physical  activity ?Try to get 150 minutes of moderate-intensity physical activity each week. Examples include walking briskly or bicycling slower than 10 miles an hour (16 km an hour). ?Do muscle-strengthening exercises on 2 or more days a week. ?If you find it difficult to fit regular physical activity into your schedule, try: ?Taking the stairs instead of the elevator. ?Parking your car farther from the entrance or at the back of the parking lot. ?Biking or walking to work or school. ?If you need to lose weight, you may need to reduce your daily calorie intake and increase your daily amount of physical activity. Check with your health care provider before you start a new diet and exercise plan. ?General instructions ?Do not skip meals, especially breakfast. ?Water is the ideal beverage. Aim to drink six 8-oz glasses of water each day. ?Avoid fad diets. These may affect your mood and growth. ?If you choose to consume alcohol: ?Drink in moderation. This means two drinks a day for men and one drink a day for nonpregnant women. One drink equals 12 oz of beer, 5 oz of wine, or 1? oz of hard liquor. ?You may drink coffee. It is recommended that you limit coffee intake to three to five 8-oz cups a day (up to 400 mg of caffeine). ?If you are worried about your body image, talk with your parents, your health care provider, or another trusted adult like a coach or counselor. You may be at risk for developing an eating disorder. Eating disorders can lead to serious medical problems. ?Food allergies may cause you to have a reaction (such as a rash, diarrhea, or vomiting) after eating or drinking. Talk with your health care provider if you have concerns about food allergies. ?Summary ?Eat a balanced diet. Include fruits, vegetables, low-fat dairy, whole grains, and lean proteins. ?Try to get 150 minutes of moderate-intensity physical activity each week, and do muscle-strengthening exercises on 2 or more days a week. ?Choose healthy  snacks that are 200 calories or less. ?Drink plenty of water. Try to drink six 8-oz glasses a day. ?This information is not intended to replace advice given to you by your health care provider. Make sure you discuss any questions you have with your health care provider. ?Document Revised: 06/03/2021 Document Reviewed: 09/10/2020 ?Elsevier Patient Education ? 2022 Else[employer] Inc. ? ?

## 2021-09-14 LAB — C. TRACHOMATIS/N. GONORRHOEAE RNA
C. trachomatis RNA, TMA: NOT DETECTED
N. gonorrhoeae RNA, TMA: NOT DETECTED

## 2021-09-14 LAB — RPR: RPR Ser Ql: NONREACTIVE

## 2021-09-14 LAB — HIV ANTIBODY (ROUTINE TESTING W REFLEX): HIV 1&2 Ab, 4th Generation: NONREACTIVE

## 2021-09-23 ENCOUNTER — Telehealth: Payer: Self-pay | Admitting: Pediatrics

## 2021-09-23 NOTE — Telephone Encounter (Signed)
Called to give recent lab results all normal.  Urine GC, HIV and RPR negative.

## 2023-02-14 ENCOUNTER — Telehealth: Payer: Self-pay

## 2023-02-14 NOTE — Telephone Encounter (Signed)
LVM for patient to call back. AS, CMA
